# Patient Record
Sex: Female | Born: 1960 | Race: White | Hispanic: No | Marital: Married
Health system: Southern US, Community
[De-identification: ages and names within clinical notes are randomized; demographics above are authoritative.]

---

## 2016-01-18 ENCOUNTER — Inpatient Hospital Stay
Admission: AD | Admit: 2016-01-18 | Discharge: 2016-02-25 | Disposition: A | Discharge status: Skilled Nursing Facility | Payer: Medicare Other | Source: Ambulatory Visit | Attending: Internal Medicine | Admitting: Internal Medicine

## 2016-01-18 ENCOUNTER — Ambulatory Visit (HOSPITAL_COMMUNITY)
Admission: AD | Admit: 2016-01-18 | Discharge: 2016-01-18 | Disposition: A | Discharge status: Home / Self Care | Payer: Medicare Other | Source: Other Acute Inpatient Hospital | Attending: Internal Medicine | Admitting: Internal Medicine

## 2016-01-18 ENCOUNTER — Ambulatory Visit (HOSPITAL_COMMUNITY)
Admission: AD | Admit: 2016-01-18 | Payer: Self-pay | Source: Other Acute Inpatient Hospital | Admitting: Internal Medicine

## 2016-01-18 ENCOUNTER — Other Ambulatory Visit (HOSPITAL_COMMUNITY): Payer: Self-pay

## 2016-01-18 DIAGNOSIS — R1915 Other abnormal bowel sounds: Secondary | ICD-10-CM

## 2016-01-18 DIAGNOSIS — R509 Fever, unspecified: Secondary | ICD-10-CM

## 2016-01-18 DIAGNOSIS — Z430 Encounter for attention to tracheostomy: Secondary | ICD-10-CM

## 2016-01-18 DIAGNOSIS — J9690 Respiratory failure, unspecified, unspecified whether with hypoxia or hypercapnia: Secondary | ICD-10-CM | POA: Diagnosis present

## 2016-01-18 DIAGNOSIS — J9600 Acute respiratory failure, unspecified whether with hypoxia or hypercapnia: Secondary | ICD-10-CM

## 2016-01-18 DIAGNOSIS — K567 Ileus, unspecified: Secondary | ICD-10-CM

## 2016-01-18 DIAGNOSIS — T80219A Unspecified infection due to central venous catheter, initial encounter: Secondary | ICD-10-CM

## 2016-01-18 DIAGNOSIS — K659 Peritonitis, unspecified: Secondary | ICD-10-CM

## 2016-01-18 DIAGNOSIS — Z431 Encounter for attention to gastrostomy: Secondary | ICD-10-CM

## 2016-01-18 DIAGNOSIS — R061 Stridor: Secondary | ICD-10-CM

## 2016-01-18 MED ORDER — DIATRIZOATE MEGLUMINE & SODIUM 66-10 % PO SOLN: 30.0000 mL | Freq: Once | ORAL | Status: DC

## 2016-01-18 MED ORDER — DIATRIZOATE MEGLUMINE & SODIUM 66-10 % PO SOLN
ORAL | Status: AC
  Administered 2016-01-18: 30 mL via GASTROSTOMY
  Filled 2016-01-18: qty 30

## 2016-01-19 ENCOUNTER — Other Ambulatory Visit (HOSPITAL_COMMUNITY): Payer: Self-pay

## 2016-01-19 DIAGNOSIS — Z930 Tracheostomy status: Secondary | ICD-10-CM

## 2016-01-19 DIAGNOSIS — J441 Chronic obstructive pulmonary disease with (acute) exacerbation: Secondary | ICD-10-CM

## 2016-01-19 DIAGNOSIS — J189 Pneumonia, unspecified organism: Secondary | ICD-10-CM

## 2016-01-19 DIAGNOSIS — J9621 Acute and chronic respiratory failure with hypoxia: Secondary | ICD-10-CM | POA: Diagnosis not present

## 2016-01-19 LAB — COMPREHENSIVE METABOLIC PANEL
ALBUMIN: 1.9 g/dL — AB (ref 3.5–5.0)
ALBUMIN: 1.8 g/dL — AB (ref 3.5–5.0)
ALK PHOS: 78 U/L (ref 38–126)
ALT: 28 U/L (ref 14–54)
ALT: 28 U/L (ref 14–54)
ANION GAP: 10 (ref 5–15)
AST: 36 U/L (ref 15–41)
AST: 34 U/L (ref 15–41)
Alkaline Phosphatase: 77 U/L (ref 38–126)
Anion gap: 12 (ref 5–15)
BILIRUBIN TOTAL: 1 mg/dL (ref 0.3–1.2)
BILIRUBIN TOTAL: 1.3 mg/dL — AB (ref 0.3–1.2)
BUN: 31 mg/dL — AB (ref 6–20)
BUN: 31 mg/dL — AB (ref 6–20)
CALCIUM: 8.8 mg/dL — AB (ref 8.9–10.3)
CHLORIDE: 115 mmol/L — AB (ref 101–111)
CO2: 21 mmol/L — AB (ref 22–32)
CO2: 20 mmol/L — ABNORMAL LOW (ref 22–32)
CREATININE: 1.46 mg/dL — AB (ref 0.44–1.00)
Calcium: 8.6 mg/dL — ABNORMAL LOW (ref 8.9–10.3)
Chloride: 114 mmol/L — ABNORMAL HIGH (ref 101–111)
Creatinine, Ser: 1.45 mg/dL — ABNORMAL HIGH (ref 0.44–1.00)
GFR calc Af Amer: 46 mL/min — ABNORMAL LOW (ref >60)
GFR calc Af Amer: 46 mL/min — ABNORMAL LOW (ref >60)
GFR calc non Af Amer: 40 mL/min — ABNORMAL LOW (ref >60)
GFR, EST NON AFRICAN AMERICAN: 39 mL/min — AB (ref >60)
GLUCOSE: 168 mg/dL — AB (ref 65–99)
GLUCOSE: 153 mg/dL — AB (ref 65–99)
Potassium: 3.8 mmol/L (ref 3.5–5.1)
Potassium: 3.7 mmol/L (ref 3.5–5.1)
SODIUM: 145 mmol/L (ref 135–145)
Sodium: 147 mmol/L — ABNORMAL HIGH (ref 135–145)
TOTAL PROTEIN: 7.2 g/dL (ref 6.5–8.1)
Total Protein: 7.1 g/dL (ref 6.5–8.1)

## 2016-01-19 LAB — BLOOD GAS, ARTERIAL
ACID-BASE DEFICIT: 0.1 mmol/L (ref 0.0–2.0)
Bicarbonate: 24.2 mmol/L (ref 20.0–28.0)
FIO2: 40
MECHVT: 500 mL
O2 SAT: 99 %
PEEP/CPAP: 5 cmH2O
PH ART: 7.384 (ref 7.350–7.450)
PO2 ART: 156 mmHg — AB (ref 83.0–108.0)
Patient temperature: 99.6
RATE: 22 resp/min
pCO2 arterial: 41.8 mmHg (ref 32.0–48.0)

## 2016-01-19 LAB — CBC
HCT: 30.3 % — ABNORMAL LOW (ref 36.0–46.0)
HEMOGLOBIN: 9 g/dL — AB (ref 12.0–15.0)
MCH: 27.3 pg (ref 26.0–34.0)
MCHC: 29.7 g/dL — AB (ref 30.0–36.0)
MCV: 91.8 fL (ref 78.0–100.0)
Platelets: 424 10*3/uL — ABNORMAL HIGH (ref 150–400)
RBC: 3.3 MIL/uL — ABNORMAL LOW (ref 3.87–5.11)
RDW: 15.4 % (ref 11.5–15.5)
WBC: 9.3 10*3/uL (ref 4.0–10.5)

## 2016-01-19 LAB — PROTIME-INR
INR: 1.2
PROTHROMBIN TIME: 15.3 s — AB (ref 11.4–15.2)

## 2016-01-19 LAB — VANCOMYCIN, TROUGH: Vancomycin Tr: 17 ug/mL (ref 15–20)

## 2016-01-19 LAB — PHOSPHORUS: PHOSPHORUS: 2.9 mg/dL (ref 2.5–4.6)

## 2016-01-19 LAB — TRIGLYCERIDES: TRIGLYCERIDES: 512 mg/dL — AB (ref <150)

## 2016-01-19 LAB — MAGNESIUM: Magnesium: 2.3 mg/dL (ref 1.7–2.4)

## 2016-01-19 NOTE — Consult Note (Signed)
Name: Brenda Soto MRN: [id number] DOB: May 12, 1960    ADMISSION DATE:  01/18/2016 CONSULTATION DATE:  9/27  REFERRING MD :  Sharyon Medicus ([facility])   CHIEF COMPLAINT:  Vent management   BRIEF PATIENT DESCRIPTION: 55yo female obese female heavy smoker with hx chronic respiratory failure on 2-3L home O2 r/t COPD as well as hx HTN, DM, diverticulitis who was originally admitted to outside hospital 9/14 with abd pain.  She was found to have sigmoid abscess with perforation and was taken urgently to OR for exploratory lap with sigmoidectomy and diverting ileostomy.  She remained vented post op and failed numerous SBT's and ultimately underwent tracheostomy and PEG placement.  Course was c/b severe sepsis r/t peritonitis and acute renal failure.  She was tx to [facility] 9[facility] for ongoing attempts at vent wean.  PCCM consulted to assist.   SIGNIFICANT EVENTS    STUDIES:     HISTORY OF PRESENT ILLNESS:  55yo female obese female smoker with hx chronic respiratory failure r/t COPD and diverticulitis who was originally admitted to outside hospital 9/14 with abd pain.  She was found to have sigmoid abscess with perforation and was taken urgently to OR for exploratory lap with sigmoidectomy and diverting ileostomy.  She remained vented post op and failed numerous SBT's.  Course was c/b severe sepsis r/t peritonitis.  She was tx to [facility] 9/26 for ongoing attempts at vent wean.  PCCM consulted to assist.   Currently resting comfortably.  Denies pain.  Family at bedside.  On full support.    PAST MEDICAL HISTORY :  COPD  Chronic respiratory failure  Sepsis  diverticulitis  Anxiety  HTN  Obesity   Prior to Admission medications   Not on File   Allergies  Allergen Reactions  . Codeine   . Morphine And Related   . Sulfur     FAMILY HISTORY:  family history is not on file. SOCIAL HISTORY: Active smoker   REVIEW OF SYSTEMS:   As per HPI - All other systems reviewed and were neg.     SUBJECTIVE:   VITAL SIGNS: Reviewed at bedside.  Abnormal values discussed in Imp/plan.   PHYSICAL EXAMINATION: General:  Obese female, NAD on vent  Neuro:  Somnolent but arousable (ABG ok), follows commands, smiles at joke from her family, MAE, gen weakness  HEENT:  Mm moist, trach c/d  Cardiovascular:  s1s2 rrr Lungs:  resps even non labored on vent, diminished bases, rare exp wheeze  Abdomen:  Round, soft, mildly tender, PEG, midline incision  Musculoskeletal:  1+ BLE edema   No results for input(s): NA, K, CL, CO2, BUN, CREATININE, GLUCOSE in the last 168 hours. No results for input(s): HGB, HCT, WBC, PLT in the last 168 hours. Dg Chest Port 1 View  Result Date: 01/19/2016 CLINICAL DATA:  Chronic respiratory failure EXAM: PORTABLE CHEST 1 VIEW COMPARISON:  None. FINDINGS: Tracheostomy catheter tip is 5.3 cm above the carina. Central catheter tip is in the superior vena cava. No pneumothorax. There is no appreciable edema or consolidation. Heart is borderline prominent with pulmonary vascularity within normal limits. There is prominence in the inferior right hilar region which may be due to superimposed vessels but raises concern for potential lymph node enlargement in this area. No other areas suggesting potential adenopathy. No bone lesions. IMPRESSION: Question vascular prominence versus adenopathy inferior right hilar region. Upright frontal and lateral views advised if patient is able. No edema or consolidation. Tube and catheter positions as described without  pneumothorax. Electronically Signed   By: Bretta BangWilliam  Woodruff III M.D.   On: 01/19/2016 07:55   Dg Abd Portable 1v  Result Date: 01/18/2016 CLINICAL DATA:  G-tube readjustment.  Initial encounter. EXAM: PORTABLE ABDOMEN - 1 VIEW COMPARISON:  None. FINDINGS: The patient's G-tube is seen overlying the body of the stomach, with injected contrast filling the fundus of the stomach as expected. There is dilatation of small-bowel  loops to 4.9 cm in maximal diameter, which may reflect ileus or small-bowel obstruction. Would correlate with the patient's symptoms. No free intra-abdominal air is seen, though evaluation for free air is limited on a single supine view. No acute osseous abnormalities are identified. IMPRESSION: 1. G-tube noted overlying the body of the stomach, with injected contrast filling the fundus of the stomach as expected. 2. Dilatation of small-bowel loops to 4.9 cm in maximal diameter, which may reflect ileus or small-bowel obstruction. Would correlate with the patient's symptoms. Electronically Signed   By: Roanna RaiderJeffery  Chang M.D.   On: 01/18/2016 22:44    ASSESSMENT / PLAN:  Acute on chronic respiratory failure - post exploratory lap with severe sepsis and underlying severe COPD.  Severe sepsis/peritonitis - resolved.  AKI   REC -  Continue vanc, zosyn per primary -- can likely narrow quickly. Unsure how many days total abx she has had Continue BD's - pulmicort, duoneb  Vent wean per protocol  PT/OT  Check cbc, chem now  Smoking cessation  F/u CXR  Pulmonary hygiene  No need steroids at this time    Dirk DressKaty Whiteheart, NP 01/19/2016  9:38 AM Pager: [phone] or [phone]  Attending Note:  55 year old female with acute on chronic respiratory failure post ex-lap with severe sepsis and COPD.  PCCM consulted for vent management.  On exam, patient is sedate and not following commands with coarse BS diffusely.  I reviewed CXR myself, trach in good position.  Discussed with PCCM-NP.  COPD:  - BD as ordered.  - Pulmicort.  - PRN albuterol.  Respiratory failure:  - Proceed with weaning per protocol.  - Keep dry.  Hypoxemia:  - Titrate O2 for sat of 88-92%  HCAP:  - Vanc and zosyn.  - F/U on cultures.  Trach status:  - Maintain current size and type.  Patient seen and examined, agree with above note.  I dictated the care and orders written for this patient under my  direction.  Alyson ReedyWesam G Dolores Mcgovern, MD [phone]

## 2016-01-20 ENCOUNTER — Other Ambulatory Visit (HOSPITAL_COMMUNITY): Payer: Self-pay

## 2016-01-20 LAB — COMPREHENSIVE METABOLIC PANEL
ALT: 26 U/L (ref 14–54)
AST: 26 U/L (ref 15–41)
Albumin: 1.9 g/dL — ABNORMAL LOW (ref 3.5–5.0)
Alkaline Phosphatase: 74 U/L (ref 38–126)
Anion gap: 8 (ref 5–15)
BILIRUBIN TOTAL: 0.7 mg/dL (ref 0.3–1.2)
BUN: 33 mg/dL — ABNORMAL HIGH (ref 6–20)
CALCIUM: 8.6 mg/dL — AB (ref 8.9–10.3)
CO2: 23 mmol/L (ref 22–32)
CREATININE: 1.5 mg/dL — AB (ref 0.44–1.00)
Chloride: 117 mmol/L — ABNORMAL HIGH (ref 101–111)
GFR calc Af Amer: 44 mL/min — ABNORMAL LOW (ref >60)
GFR, EST NON AFRICAN AMERICAN: 38 mL/min — AB (ref >60)
Glucose, Bld: 217 mg/dL — ABNORMAL HIGH (ref 65–99)
Potassium: 3.3 mmol/L — ABNORMAL LOW (ref 3.5–5.1)
Sodium: 148 mmol/L — ABNORMAL HIGH (ref 135–145)
Total Protein: 7 g/dL (ref 6.5–8.1)

## 2016-01-21 LAB — BASIC METABOLIC PANEL
ANION GAP: 7 (ref 5–15)
BUN: 35 mg/dL — ABNORMAL HIGH (ref 6–20)
CALCIUM: 8.5 mg/dL — AB (ref 8.9–10.3)
CO2: 20 mmol/L — AB (ref 22–32)
CREATININE: 1.58 mg/dL — AB (ref 0.44–1.00)
Chloride: 118 mmol/L — ABNORMAL HIGH (ref 101–111)
GFR calc non Af Amer: 36 mL/min — ABNORMAL LOW (ref >60)
GFR, EST AFRICAN AMERICAN: 41 mL/min — AB (ref >60)
Glucose, Bld: 251 mg/dL — ABNORMAL HIGH (ref 65–99)
Potassium: 3.7 mmol/L (ref 3.5–5.1)
SODIUM: 145 mmol/L (ref 135–145)

## 2016-01-21 LAB — HEPATIC FUNCTION PANEL
ALBUMIN: 1.9 g/dL — AB (ref 3.5–5.0)
ALT: 22 U/L (ref 14–54)
AST: 20 U/L (ref 15–41)
Alkaline Phosphatase: 70 U/L (ref 38–126)
BILIRUBIN TOTAL: 0.9 mg/dL (ref 0.3–1.2)
Bilirubin, Direct: 0.3 mg/dL (ref 0.1–0.5)
Indirect Bilirubin: 0.6 mg/dL (ref 0.3–0.9)
TOTAL PROTEIN: 6.6 g/dL (ref 6.5–8.1)

## 2016-01-22 LAB — BASIC METABOLIC PANEL
ANION GAP: 9 (ref 5–15)
BUN: 33 mg/dL — ABNORMAL HIGH (ref 6–20)
CO2: 19 mmol/L — ABNORMAL LOW (ref 22–32)
Calcium: 8.2 mg/dL — ABNORMAL LOW (ref 8.9–10.3)
Chloride: 114 mmol/L — ABNORMAL HIGH (ref 101–111)
Creatinine, Ser: 1.37 mg/dL — ABNORMAL HIGH (ref 0.44–1.00)
GFR calc Af Amer: 49 mL/min — ABNORMAL LOW (ref >60)
GFR, EST NON AFRICAN AMERICAN: 43 mL/min — AB (ref >60)
GLUCOSE: 282 mg/dL — AB (ref 65–99)
POTASSIUM: 3.8 mmol/L (ref 3.5–5.1)
SODIUM: 142 mmol/L (ref 135–145)

## 2016-01-22 LAB — VANCOMYCIN, TROUGH: VANCOMYCIN TR: 20 ug/mL (ref 15–20)

## 2016-01-23 LAB — CBC WITH DIFFERENTIAL/PLATELET
BASOS ABS: 0 10*3/uL (ref 0.0–0.1)
Basophils Relative: 0 %
Eosinophils Absolute: 0.2 10*3/uL (ref 0.0–0.7)
Eosinophils Relative: 3 %
HEMATOCRIT: 29.7 % — AB (ref 36.0–46.0)
HEMOGLOBIN: 8.6 g/dL — AB (ref 12.0–15.0)
LYMPHS PCT: 18 %
Lymphs Abs: 1.4 10*3/uL (ref 0.7–4.0)
MCH: 27 pg (ref 26.0–34.0)
MCHC: 29 g/dL — ABNORMAL LOW (ref 30.0–36.0)
MCV: 93.1 fL (ref 78.0–100.0)
MONO ABS: 0.4 10*3/uL (ref 0.1–1.0)
MONOS PCT: 5 %
NEUTROS ABS: 5.7 10*3/uL (ref 1.7–7.7)
Neutrophils Relative %: 74 %
Platelets: 329 10*3/uL (ref 150–400)
RBC: 3.19 MIL/uL — ABNORMAL LOW (ref 3.87–5.11)
RDW: 15.2 % (ref 11.5–15.5)
WBC: 7.7 10*3/uL (ref 4.0–10.5)

## 2016-01-23 LAB — COMPREHENSIVE METABOLIC PANEL
ALK PHOS: 64 U/L (ref 38–126)
ALT: 17 U/L (ref 14–54)
ANION GAP: 7 (ref 5–15)
AST: 13 U/L — AB (ref 15–41)
Albumin: 1.8 g/dL — ABNORMAL LOW (ref 3.5–5.0)
BILIRUBIN TOTAL: 0.7 mg/dL (ref 0.3–1.2)
BUN: 34 mg/dL — ABNORMAL HIGH (ref 6–20)
CALCIUM: 8.6 mg/dL — AB (ref 8.9–10.3)
CO2: 21 mmol/L — ABNORMAL LOW (ref 22–32)
Chloride: 119 mmol/L — ABNORMAL HIGH (ref 101–111)
Creatinine, Ser: 1.43 mg/dL — ABNORMAL HIGH (ref 0.44–1.00)
GFR calc Af Amer: 47 mL/min — ABNORMAL LOW (ref >60)
GFR, EST NON AFRICAN AMERICAN: 40 mL/min — AB (ref >60)
GLUCOSE: 258 mg/dL — AB (ref 65–99)
Potassium: 3.8 mmol/L (ref 3.5–5.1)
Sodium: 147 mmol/L — ABNORMAL HIGH (ref 135–145)
TOTAL PROTEIN: 6.4 g/dL — AB (ref 6.5–8.1)

## 2016-01-24 DIAGNOSIS — J9601 Acute respiratory failure with hypoxia: Secondary | ICD-10-CM | POA: Diagnosis not present

## 2016-01-24 DIAGNOSIS — Z430 Encounter for attention to tracheostomy: Secondary | ICD-10-CM | POA: Diagnosis not present

## 2016-01-24 DIAGNOSIS — J9600 Acute respiratory failure, unspecified whether with hypoxia or hypercapnia: Secondary | ICD-10-CM

## 2016-01-24 LAB — COMPREHENSIVE METABOLIC PANEL
ALK PHOS: 76 U/L (ref 38–126)
ALT: 19 U/L (ref 14–54)
AST: 22 U/L (ref 15–41)
Albumin: 1.9 g/dL — ABNORMAL LOW (ref 3.5–5.0)
Anion gap: 7 (ref 5–15)
BUN: 31 mg/dL — AB (ref 6–20)
CALCIUM: 8.6 mg/dL — AB (ref 8.9–10.3)
CO2: 22 mmol/L (ref 22–32)
CREATININE: 1.31 mg/dL — AB (ref 0.44–1.00)
Chloride: 117 mmol/L — ABNORMAL HIGH (ref 101–111)
GFR calc non Af Amer: 45 mL/min — ABNORMAL LOW (ref >60)
GFR, EST AFRICAN AMERICAN: 52 mL/min — AB (ref >60)
Glucose, Bld: 253 mg/dL — ABNORMAL HIGH (ref 65–99)
Potassium: 4.1 mmol/L (ref 3.5–5.1)
SODIUM: 146 mmol/L — AB (ref 135–145)
Total Bilirubin: 0.8 mg/dL (ref 0.3–1.2)
Total Protein: 6.4 g/dL — ABNORMAL LOW (ref 6.5–8.1)

## 2016-01-24 LAB — TRIGLYCERIDES: Triglycerides: 479 mg/dL — ABNORMAL HIGH (ref <150)

## 2016-01-24 LAB — PHOSPHORUS: PHOSPHORUS: 3.7 mg/dL (ref 2.5–4.6)

## 2016-01-24 LAB — MAGNESIUM: Magnesium: 2.2 mg/dL (ref 1.7–2.4)

## 2016-01-24 NOTE — Progress Notes (Signed)
   Name: Brenda Soto MRN: [id number] DOB: 1960-10-03    ADMISSION DATE:  01/18/2016 CONSULTATION DATE:  9/27  REFERRING MD :  Sharyon MedicusHijazi ([facility])   CHIEF COMPLAINT:  Vent management   BRIEF PATIENT DESCRIPTION: 55yo female obese female heavy smoker with hx chronic respiratory failure on 2-3L home O2 r/t COPD as well as hx HTN, DM, diverticulitis who was originally admitted to outside hospital 9/14 with abd pain.  She was found to have sigmoid abscess with perforation and was taken urgently to OR for exploratory lap with sigmoidectomy and diverting ileostomy.  She remained vented post op and failed numerous SBT's and ultimately underwent tracheostomy and PEG placement.  Course was c/b severe sepsis r/t peritonitis and acute renal failure.  She was tx to [facility] for ongoing attempts at vent wean.  PCCM consulted to assist.   SIGNIFICANT EVENTS    STUDIES:     HISTORY OF PRESENT ILLNESS:  55yo female obese female smoker with hx chronic respiratory failure r/t COPD and diverticulitis who was originally admitted to outside hospital 9/14 with abd pain.  She was found to have sigmoid abscess with perforation and was taken urgently to OR for exploratory lap with sigmoidectomy and diverting ileostomy.  She remained vented post op and failed numerous SBT's.  Course was c/b severe sepsis r/t peritonitis.  She was tx to [facility] for ongoing attempts at vent wean.  PCCM consulted to assist.    SUBJECTIVE:  Comfortable but with anxiety issues. Doing PST on vent x 12 hrs alternating with ATC.  With anxiety issues.    VITAL SIGNS: Reviewed at bedside. HR 80s. O2 sats 98% on 30% Fio2.  PHYSICAL EXAMINATION: General:  Obese female, NAD on vent  Neuro:  Somnolent but arousable (ABG ok), follows commands, smiles at joke from her family, MAE, gen weakness  HEENT:  Mm moist, trach c/d  Cardiovascular:  s1s2 rrr Lungs:  resps even non labored on vent, diminished bases, rare exp wheeze    Abdomen:  Round, soft, mildly tender, PEG, midline incision  Musculoskeletal:  1+ BLE edema    Recent Labs Lab 01/22/16 0752 01/23/16 0624 01/24/16 1042  NA 142 147* 146*  K 3.8 3.8 4.1  CL 114* 119* 117*  CO2 19* 21* 22  BUN 33* 34* 31*  CREATININE 1.37* 1.43* 1.31*  GLUCOSE 282* 258* 253*    Recent Labs Lab 01/19/16 1048 01/23/16 0624  HGB 9.0* 8.6*  HCT 30.3* 29.7*  WBC 9.3 7.7  PLT 424* 329   No results found.  ASSESSMENT / PLAN:  Acute on chronic respiratory failure - post exploratory lap with severe sepsis and underlying severe COPD.  S/P tracheostomy Severe sepsis/peritonitis - resolved.  AKI  Anxiety  REC -  PST and ATC as tolerated. Wean off o2 to keep o2 sats > 88%. Need to address anxiety issues which will affect weaning.  Deescalate abx once able.  Continue BD's - pulmicort, duoneb  Vent wean per protocol  PT/OT  Pulmonary hygiene   J. Alexis FrockAngelo A de Dios, MD 01/24/2016, 2:58 PM [facility] Pager [phone] After 3 pm or if no answer, call [phone]

## 2016-01-25 ENCOUNTER — Other Ambulatory Visit (HOSPITAL_COMMUNITY): Payer: Self-pay

## 2016-01-25 LAB — COMPREHENSIVE METABOLIC PANEL
ALT: 19 U/L (ref 14–54)
AST: 18 U/L (ref 15–41)
Albumin: 2 g/dL — ABNORMAL LOW (ref 3.5–5.0)
Alkaline Phosphatase: 80 U/L (ref 38–126)
Anion gap: 7 (ref 5–15)
BILIRUBIN TOTAL: 0.4 mg/dL (ref 0.3–1.2)
BUN: 30 mg/dL — ABNORMAL HIGH (ref 6–20)
CHLORIDE: 115 mmol/L — AB (ref 101–111)
CO2: 20 mmol/L — ABNORMAL LOW (ref 22–32)
CREATININE: 1.17 mg/dL — AB (ref 0.44–1.00)
Calcium: 8.6 mg/dL — ABNORMAL LOW (ref 8.9–10.3)
GFR, EST AFRICAN AMERICAN: 60 mL/min — AB (ref >60)
GFR, EST NON AFRICAN AMERICAN: 51 mL/min — AB (ref >60)
Glucose, Bld: 188 mg/dL — ABNORMAL HIGH (ref 65–99)
POTASSIUM: 3.8 mmol/L (ref 3.5–5.1)
Sodium: 142 mmol/L (ref 135–145)
TOTAL PROTEIN: 6.5 g/dL (ref 6.5–8.1)

## 2016-01-25 LAB — HEMOGLOBIN AND HEMATOCRIT, BLOOD
HEMATOCRIT: 27.1 % — AB (ref 36.0–46.0)
HEMOGLOBIN: 8.1 g/dL — AB (ref 12.0–15.0)

## 2016-01-26 LAB — MAGNESIUM: MAGNESIUM: 2.2 mg/dL (ref 1.7–2.4)

## 2016-01-26 LAB — BASIC METABOLIC PANEL
ANION GAP: 7 (ref 5–15)
BUN: 35 mg/dL — AB (ref 6–20)
CO2: 21 mmol/L — AB (ref 22–32)
Calcium: 8.6 mg/dL — ABNORMAL LOW (ref 8.9–10.3)
Chloride: 112 mmol/L — ABNORMAL HIGH (ref 101–111)
Creatinine, Ser: 1.36 mg/dL — ABNORMAL HIGH (ref 0.44–1.00)
GFR calc Af Amer: 50 mL/min — ABNORMAL LOW (ref >60)
GFR, EST NON AFRICAN AMERICAN: 43 mL/min — AB (ref >60)
GLUCOSE: 215 mg/dL — AB (ref 65–99)
POTASSIUM: 4.7 mmol/L (ref 3.5–5.1)
Sodium: 140 mmol/L (ref 135–145)

## 2016-01-27 ENCOUNTER — Other Ambulatory Visit (HOSPITAL_COMMUNITY): Payer: Self-pay

## 2016-01-27 DIAGNOSIS — R0902 Hypoxemia: Secondary | ICD-10-CM | POA: Diagnosis not present

## 2016-01-27 DIAGNOSIS — F411 Generalized anxiety disorder: Secondary | ICD-10-CM

## 2016-01-27 DIAGNOSIS — J9601 Acute respiratory failure with hypoxia: Secondary | ICD-10-CM | POA: Diagnosis not present

## 2016-01-27 DIAGNOSIS — Z430 Encounter for attention to tracheostomy: Secondary | ICD-10-CM | POA: Diagnosis not present

## 2016-01-27 LAB — URINALYSIS, ROUTINE W REFLEX MICROSCOPIC
Bilirubin Urine: NEGATIVE
Glucose, UA: NEGATIVE mg/dL
Ketones, ur: NEGATIVE mg/dL
Nitrite: NEGATIVE
Protein, ur: 100 mg/dL — AB
Specific Gravity, Urine: 1.011 (ref 1.005–1.030)
pH: 5.5 (ref 5.0–8.0)

## 2016-01-27 LAB — HEPATIC FUNCTION PANEL
ALBUMIN: 1.9 g/dL — AB (ref 3.5–5.0)
ALK PHOS: 121 U/L (ref 38–126)
ALT: 22 U/L (ref 14–54)
AST: 27 U/L (ref 15–41)
BILIRUBIN TOTAL: 0.5 mg/dL (ref 0.3–1.2)
Bilirubin, Direct: 0.2 mg/dL (ref 0.1–0.5)
Indirect Bilirubin: 0.3 mg/dL (ref 0.3–0.9)
TOTAL PROTEIN: 6.5 g/dL (ref 6.5–8.1)

## 2016-01-27 LAB — URINE MICROSCOPIC-ADD ON

## 2016-01-27 LAB — CBC
HEMATOCRIT: 26.3 % — AB (ref 36.0–46.0)
HEMOGLOBIN: 8 g/dL — AB (ref 12.0–15.0)
MCH: 27.2 pg (ref 26.0–34.0)
MCHC: 30.4 g/dL (ref 30.0–36.0)
MCV: 89.5 fL (ref 78.0–100.0)
Platelets: 281 10*3/uL (ref 150–400)
RBC: 2.94 MIL/uL — AB (ref 3.87–5.11)
RDW: 15.7 % — ABNORMAL HIGH (ref 11.5–15.5)
WBC: 7.1 10*3/uL (ref 4.0–10.5)

## 2016-01-27 NOTE — Progress Notes (Signed)
   Name: Brenda Soto MRN: [id number] DOB: 1961/01/29    ADMISSION DATE:  01/18/2016 CONSULTATION DATE:  9/27  REFERRING MD :  Sharyon MedicusHijazi ([facility]   CHIEF COMPLAINT:  Vent management   BRIEF PATIENT DESCRIPTION: MO female on vent  SIGNIFICANT EVENTS    STUDIES:     HISTORY OF PRESENT ILLNESS:  55yo female obese female smoker with hx chronic respiratory failure r/t COPD and diverticulitis who was originally admitted to outside hospital 9/14 with abd pain.  She was found to have sigmoid abscess with perforation and was taken urgently to OR for exploratory lap with sigmoidectomy and diverting ileostomy.  She remained vented post op and failed numerous SBT's.  Course was c/b severe sepsis r/t peritonitis.  She was tx to [facility] for ongoing attempts at vent wean.  PCCM consulted to assist.    SUBJECTIVE:  Comfortable but with anxiety issues. Doing PST on vent x 12 hrs alternating with ATC.  Currently on full vent support    VITAL SIGNS: Reviewed at bedside. 121/74 78 98.7 99% 28  PHYSICAL EXAMINATION: General:  Obese female, NAD on vent  Neuro:  Somnolent but arousable (ABG ok), follows commands.  MAE, gen weakness  HEENT:  Mm moist, trach c/d  Cardiovascular:  s1s2 rrr Lungs:  resps even non labored on vent, diminished bases, rare exp wheeze  Abdomen:  Round, soft, mildly tender, PEG, midline incision  Musculoskeletal:  1+ BLE edema    Recent Labs Lab 01/24/16 1042 01/25/16 1254 01/26/16 0744  NA 146* 142 140  K 4.1 3.8 4.7  CL 117* 115* 112*  CO2 22 20* 21*  BUN 31* 30* 35*  CREATININE 1.31* 1.17* 1.36*  GLUCOSE 253* 188* 215*    Recent Labs Lab 01/23/16 0624 01/25/16 1254  HGB 8.6* 8.1*  HCT 29.7* 27.1*  WBC 7.7  --   PLT 329  --    No results found.  ASSESSMENT / PLAN:  Acute on chronic respiratory failure - post exploratory lap with severe sepsis and underlying severe COPD.  S/P tracheostomy Severe sepsis/peritonitis - resolved.  AKI    Anxiety  REC -  PST and ATC as tolerated. Wean off o2 to keep o2 sats > 88%. Need to address anxiety issues which will affect weaning.  Deescalate abx once able.  Continue BD's - pulmicort, duoneb  Vent wean per protocol  PT/OT  Pulmonary hygiene   PCCM will see weekly on Mondays  [facility] Minor ACNP Ad[facility] PollackLe [facility]auer PCCM Pager [phone] till 3 pm If no answer page [phone] 01/27/2016, 10:48 AM  Attending Note:  55 year old female with morbid obesity had a perforation and was difficult to wean.  Was trached. On exam, patient appears very anxious.  Suspect anxiety is a major component of her failure.  I reviewed CXR myself, trach is in good position.  Discussed with PCCM-NP and [facility]-RT.    Respiratory failure:  - Goal PS for 12 hours.  - Maintain 6XLT trach.  Anxiety:  - Klonipin  - Monitor.  Hypoxemia:  - Titrate O2 for sat of 88-92%.  Trach status:  - Maintain current trach size and type.  Patient seen and examined, agree with above note.  I dictated the care and orders written for this patient under my direction.  Alyson ReedyWesam G Yacoub, MD [phone]

## 2016-01-28 LAB — BLOOD CULTURE ID PANEL (REFLEXED)

## 2016-01-28 LAB — HEPATIC FUNCTION PANEL
ALT: 20 U/L (ref 14–54)
AST: 20 U/L (ref 15–41)
Albumin: 1.9 g/dL — ABNORMAL LOW (ref 3.5–5.0)
Alkaline Phosphatase: 105 U/L (ref 38–126)
BILIRUBIN DIRECT: 0.2 mg/dL (ref 0.1–0.5)
Indirect Bilirubin: 0.3 mg/dL (ref 0.3–0.9)
Total Bilirubin: 0.5 mg/dL (ref 0.3–1.2)
Total Protein: 6.3 g/dL — ABNORMAL LOW (ref 6.5–8.1)

## 2016-01-28 LAB — BASIC METABOLIC PANEL
Anion gap: 8 (ref 5–15)
BUN: 34 mg/dL — AB (ref 6–20)
CO2: 23 mmol/L (ref 22–32)
CREATININE: 1.45 mg/dL — AB (ref 0.44–1.00)
Calcium: 8.6 mg/dL — ABNORMAL LOW (ref 8.9–10.3)
Chloride: 107 mmol/L (ref 101–111)
GFR, EST AFRICAN AMERICAN: 46 mL/min — AB (ref >60)
GFR, EST NON AFRICAN AMERICAN: 40 mL/min — AB (ref >60)
GLUCOSE: 255 mg/dL — AB (ref 65–99)
Potassium: 4.6 mmol/L (ref 3.5–5.1)
SODIUM: 138 mmol/L (ref 135–145)

## 2016-01-29 LAB — URINE CULTURE

## 2016-01-29 LAB — BASIC METABOLIC PANEL
ANION GAP: 6 (ref 5–15)
BUN: 39 mg/dL — AB (ref 6–20)
CALCIUM: 8.4 mg/dL — AB (ref 8.9–10.3)
CO2: 22 mmol/L (ref 22–32)
Chloride: 112 mmol/L — ABNORMAL HIGH (ref 101–111)
Creatinine, Ser: 1.59 mg/dL — ABNORMAL HIGH (ref 0.44–1.00)
GFR calc Af Amer: 41 mL/min — ABNORMAL LOW (ref >60)
GFR, EST NON AFRICAN AMERICAN: 36 mL/min — AB (ref >60)
GLUCOSE: 194 mg/dL — AB (ref 65–99)
POTASSIUM: 5.1 mmol/L (ref 3.5–5.1)
SODIUM: 140 mmol/L (ref 135–145)

## 2016-01-29 LAB — BLOOD CULTURE ID PANEL (REFLEXED)
Acinetobacter baumannii: NOT DETECTED
Candida albicans: NOT DETECTED
Candida glabrata: DETECTED — AB
Candida krusei: NOT DETECTED
Candida parapsilosis: NOT DETECTED
Candida tropicalis: NOT DETECTED
ENTEROBACTER CLOACAE COMPLEX: NOT DETECTED
ENTEROCOCCUS SPECIES: NOT DETECTED
ESCHERICHIA COLI: NOT DETECTED
Enterobacteriaceae species: NOT DETECTED
Haemophilus influenzae: NOT DETECTED
Klebsiella oxytoca: NOT DETECTED
Klebsiella pneumoniae: NOT DETECTED
LISTERIA MONOCYTOGENES: NOT DETECTED
NEISSERIA MENINGITIDIS: NOT DETECTED
PROTEUS SPECIES: NOT DETECTED
PSEUDOMONAS AERUGINOSA: NOT DETECTED
SERRATIA MARCESCENS: NOT DETECTED
STAPHYLOCOCCUS AUREUS BCID: NOT DETECTED
STAPHYLOCOCCUS SPECIES: NOT DETECTED
STREPTOCOCCUS AGALACTIAE: NOT DETECTED
Streptococcus pneumoniae: NOT DETECTED
Streptococcus pyogenes: NOT DETECTED
Streptococcus species: NOT DETECTED

## 2016-01-29 LAB — CULTURE, BLOOD (ROUTINE X 2)

## 2016-01-30 LAB — CULTURE, RESPIRATORY W GRAM STAIN

## 2016-01-30 LAB — CULTURE, RESPIRATORY

## 2016-01-31 ENCOUNTER — Other Ambulatory Visit (HOSPITAL_COMMUNITY): Payer: Self-pay

## 2016-01-31 DIAGNOSIS — J9600 Acute respiratory failure, unspecified whether with hypoxia or hypercapnia: Secondary | ICD-10-CM

## 2016-01-31 LAB — TRIGLYCERIDES: Triglycerides: 495 mg/dL — ABNORMAL HIGH (ref <150)

## 2016-01-31 LAB — CBC
HEMATOCRIT: 26 % — AB (ref 36.0–46.0)
Hemoglobin: 7.6 g/dL — ABNORMAL LOW (ref 12.0–15.0)
MCH: 26.6 pg (ref 26.0–34.0)
MCHC: 29.2 g/dL — ABNORMAL LOW (ref 30.0–36.0)
MCV: 90.9 fL (ref 78.0–100.0)
Platelets: 295 10*3/uL (ref 150–400)
RBC: 2.86 MIL/uL — AB (ref 3.87–5.11)
RDW: 16.2 % — ABNORMAL HIGH (ref 11.5–15.5)
WBC: 7.6 10*3/uL (ref 4.0–10.5)

## 2016-01-31 LAB — COMPREHENSIVE METABOLIC PANEL
ALBUMIN: 1.9 g/dL — AB (ref 3.5–5.0)
ALK PHOS: 219 U/L — AB (ref 38–126)
ALT: 20 U/L (ref 14–54)
ANION GAP: 9 (ref 5–15)
AST: 19 U/L (ref 15–41)
BUN: 45 mg/dL — ABNORMAL HIGH (ref 6–20)
CO2: 23 mmol/L (ref 22–32)
Calcium: 8.5 mg/dL — ABNORMAL LOW (ref 8.9–10.3)
Chloride: 109 mmol/L (ref 101–111)
Creatinine, Ser: 1.42 mg/dL — ABNORMAL HIGH (ref 0.44–1.00)
GFR calc Af Amer: 47 mL/min — ABNORMAL LOW (ref >60)
GFR calc non Af Amer: 41 mL/min — ABNORMAL LOW (ref >60)
GLUCOSE: 180 mg/dL — AB (ref 65–99)
POTASSIUM: 4.6 mmol/L (ref 3.5–5.1)
SODIUM: 141 mmol/L (ref 135–145)
Total Bilirubin: 0.2 mg/dL — ABNORMAL LOW (ref 0.3–1.2)
Total Protein: 6.8 g/dL (ref 6.5–8.1)

## 2016-01-31 LAB — PHOSPHORUS: Phosphorus: 4.7 mg/dL — ABNORMAL HIGH (ref 2.5–4.6)

## 2016-01-31 LAB — CULTURE, BLOOD (ROUTINE X 2)

## 2016-01-31 LAB — MAGNESIUM: Magnesium: 2.1 mg/dL (ref 1.7–2.4)

## 2016-01-31 NOTE — Progress Notes (Signed)
   Name: Brenda Soto MRN: [id number] DOB: June 15, 1960    ADMISSION DATE:  01/18/2016 CONSULTATION DATE:  9/27  REFERRING MD :  Sharyon MedicusHijazi ([facility]   BRIEF PATIENT DESCRIPTION: MO female on vent  SIGNIFICANT EVENTS   STUDIES:   HISTORY OF PRESENT ILLNESS:  55yo female obese female smoker with hx chronic respiratory failure r/t COPD and diverticulitis who was originally admitted to outside hospital 9/14 with abd pain.  She was found to have sigmoid abscess with perforation and was taken urgently to OR for exploratory lap with sigmoidectomy and diverting ileostomy.  She remained vented post op and failed numerous SBT's.  Course was c/b severe sepsis r/t peritonitis.  She was tx to [facility] for ongoing attempts at vent wean.  PCCM consulted to assist.    SUBJECTIVE:  Currently failing PSV wean, which she has only been on for about 2 hours at this point. Appear anxious.   VITAL SIGNS: Reviewed at bedside. 98.28F, 70, 27, 144/78, 88% on 28% Fio2 PSV 12/5  PHYSICAL EXAMINATION: General:  Obese female, NAD on vent . Appears mildly anxious Neuro:  Awake on vent MAE, gen weakness  HEENT:  Mm moist, trach c/d  Cardiovascular:  s1s2 rrr Lungs:  Distant breath sounds but sound clear. Abdomen:  Round, soft, mildly tender, PEG, midline incision  Musculoskeletal:  1+ BLE edema    Recent Labs Lab 01/28/16 0534 01/29/16 0500 01/31/16 0545  NA 138 140 141  K 4.6 5.1 4.6  CL 107 112* 109  CO2 23 22 23   BUN 34* 39* 45*  CREATININE 1.45* 1.59* 1.42*  GLUCOSE 255* 194* 180*    Recent Labs Lab 01/25/16 1254 01/27/16 1500 01/31/16 0545  HGB 8.1* 8.0* 7.6*  HCT 27.1* 26.3* 26.0*  WBC  --  7.1 7.6  PLT  --  281 295   No results found.  ASSESSMENT / PLAN:  Acute on chronic respiratory failure - post exploratory lap with severe sepsis and underlying severe COPD.  S/P tracheostomy Severe sepsis/peritonitis - resolved.  Fungemia (was on TPN) AKI  Anxiety  REC -  PSV as  tolerated. Wean off O2 to keep o2 sats > 88%. Need to address anxiety issues which will affect weaning. New dx fungemia likely contributing as well. Consider adding antipsychotic Repeat CXR, may need additional diuresis Continue BD's - pulmicort, duoneb [facility]iotics[facility]ifungals [facility]  Joneen RoachPaul Hoffman, AGACNP-BC [place] Pager [phone] or [phone]  01/31/2016 12:42 PM  Attending note: I have seen and examined the patient with nurse practitioner/resident and agree with the note. History, labs and imaging reviewed.  54 Y/O with COPD, diverticulitis, sigmoid abscess s/p e lap with sigmoidectomy and diverting ileostomy Noted to have candidemia, pseudomonas in sputum  Not doing well on weaning trials today. Recheck CXR. Diurese as tolerated. On antibiotics and antifungals as per primary team  Chilton GreathousePraveen Nickola Lenig MD [facility] Pager [phone] If no answer or after 3pm call: [phone] 01/31/2016, 2:30 PM

## 2016-02-02 LAB — BASIC METABOLIC PANEL
ANION GAP: 10 (ref 5–15)
BUN: 38 mg/dL — ABNORMAL HIGH (ref 6–20)
CALCIUM: 9 mg/dL (ref 8.9–10.3)
CO2: 25 mmol/L (ref 22–32)
Chloride: 108 mmol/L (ref 101–111)
Creatinine, Ser: 1.2 mg/dL — ABNORMAL HIGH (ref 0.44–1.00)
GFR calc Af Amer: 58 mL/min — ABNORMAL LOW (ref >60)
GFR calc non Af Amer: 50 mL/min — ABNORMAL LOW (ref >60)
GLUCOSE: 141 mg/dL — AB (ref 65–99)
Potassium: 5.1 mmol/L (ref 3.5–5.1)
Sodium: 143 mmol/L (ref 135–145)

## 2016-02-02 LAB — CBC
HEMATOCRIT: 30.4 % — AB (ref 36.0–46.0)
HEMOGLOBIN: 9.2 g/dL — AB (ref 12.0–15.0)
MCH: 27.1 pg (ref 26.0–34.0)
MCHC: 30.3 g/dL (ref 30.0–36.0)
MCV: 89.4 fL (ref 78.0–100.0)
Platelets: 268 10*3/uL (ref 150–400)
RBC: 3.4 MIL/uL — ABNORMAL LOW (ref 3.87–5.11)
RDW: 16.3 % — ABNORMAL HIGH (ref 11.5–15.5)
WBC: 8.8 10*3/uL (ref 4.0–10.5)

## 2016-02-03 DIAGNOSIS — J9601 Acute respiratory failure with hypoxia: Secondary | ICD-10-CM | POA: Diagnosis not present

## 2016-02-03 NOTE — Progress Notes (Signed)
   Name: Brenda Soto MRN: [id number] DOB: 08/14/1960    ADMISSION DATE:  01/18/2016 CONSULTATION DATE:  9/27  REFERRING MD :  Sharyon MedicusHijazi ([facility]   BRIEF PATIENT DESCRIPTION: MO female on vent  SIGNIFICANT EVENTS   STUDIES:   HISTORY OF PRESENT ILLNESS:  55yo female obese female smoker with hx chronic respiratory failure r/t COPD and diverticulitis who was originally admitted to outside hospital 9/14 with abd pain.  She was found to have sigmoid abscess with perforation and was taken urgently to OR for exploratory lap with sigmoidectomy and diverting ileostomy.  She remained vented post op and failed numerous SBT's.  Course was c/b severe sepsis r/t peritonitis.  She was tx [facility] for ongoing attempts at vent wean.  PCCM consulted to assist.    SUBJECTIVE:  Weaning well on PSV today, achieved 12 hours yesterday.   VITAL SIGNS: Reviewed at bedside. 27F, P75, R19, 98% on 28% Fio2 PSV 12/5  PHYSICAL EXAMINATION: General:  Obese female, NAD on vent . Appears mildly anxious Neuro:  Awake on vent MAE, gen weakness  HEENT:  Mm moist, trach c/d  Cardiovascular:  s1s2 rrr Lungs:  Distant breath sounds but sound clear.. Tolerating SBT well. Abdomen:  Round, soft, mildly tender, PEG, midline incision  Musculoskeletal: trace BLE edema    Recent Labs Lab 01/29/16 0500 01/31/16 0545 02/02/16 0454  NA 140 141 143  K 5.1 4.6 5.1  CL 112* 109 108  CO2 22 23 25   BUN 39* 45* 38*  CREATININE 1.59* 1.42* 1.20*  GLUCOSE 194* 180* 141*    Recent Labs Lab 01/27/16 1500 01/31/16 0545 02/02/16 0454  HGB 8.0* 7.6* 9.2*  HCT 26.3* 26.0* 30.4*  WBC 7.1 7.6 8.8  PLT 281 295 268   No results found.  ASSESSMENT / PLAN:  Acute on chronic respiratory failure - post exploratory lap with severe sepsis and underlying severe COPD.  S/P tracheostomy Severe sepsis/peritonitis - resolved.  Fungemia (was on TPN) AKI  Anxiety  REC -  PSV as tolerated. Wean off O2 to keep o2 sats  > 88%.  Anxiety better controlled, but still needs some work. Consider increase clonazepam or add risperidone New dx fungemia likely contributing as well. Diurese as tolerated.  Continue BD's - pulmicort, duoneb Antibiotics[facility]als [facility]  Joneen RoachPaul Hoffman, AGACNP-BC [place] Pager [phone] or [phone]  Attending note: I have seen and examined the patient with nurse practitioner/resident and agree with the note. History, labs and imaging reviewed.  54 Y/O with COPD, diverticulitis, sigmoid abscess s/p e lap with sigmoidectomy and diverting ileostomy Noted to have candidemia, pseudomonas in sputum  Weaning in PSV. Looks more comfortable than earlier this week. On antibiotics and antifungals as per primary team. Diurese as tolerated.  Brenda GreathousePraveen Aliviah Spain MD [facility] Pager [phone] If no answer or after 3pm call: [phone] 02/03/2016, 1:00 PM

## 2016-02-04 ENCOUNTER — Other Ambulatory Visit (HOSPITAL_COMMUNITY): Payer: Self-pay

## 2016-02-04 LAB — BASIC METABOLIC PANEL
ANION GAP: 10 (ref 5–15)
BUN: 33 mg/dL — ABNORMAL HIGH (ref 6–20)
CALCIUM: 9 mg/dL (ref 8.9–10.3)
CHLORIDE: 104 mmol/L (ref 101–111)
CO2: 25 mmol/L (ref 22–32)
Creatinine, Ser: 1.26 mg/dL — ABNORMAL HIGH (ref 0.44–1.00)
GFR calc Af Amer: 55 mL/min — ABNORMAL LOW (ref >60)
GFR calc non Af Amer: 47 mL/min — ABNORMAL LOW (ref >60)
GLUCOSE: 278 mg/dL — AB (ref 65–99)
Potassium: 5.2 mmol/L — ABNORMAL HIGH (ref 3.5–5.1)
Sodium: 139 mmol/L (ref 135–145)

## 2016-02-06 LAB — CULTURE, BLOOD (ROUTINE X 2)
CULTURE: NO GROWTH
Culture: NO GROWTH

## 2016-02-07 LAB — COMPREHENSIVE METABOLIC PANEL
ALBUMIN: 2.6 g/dL — AB (ref 3.5–5.0)
ALK PHOS: 152 U/L — AB (ref 38–126)
ALT: 31 U/L (ref 14–54)
AST: 41 U/L (ref 15–41)
Anion gap: 10 (ref 5–15)
BUN: 42 mg/dL — AB (ref 6–20)
CALCIUM: 9.1 mg/dL (ref 8.9–10.3)
CHLORIDE: 104 mmol/L (ref 101–111)
CO2: 28 mmol/L (ref 22–32)
CREATININE: 1.13 mg/dL — AB (ref 0.44–1.00)
GFR calc non Af Amer: 54 mL/min — ABNORMAL LOW (ref >60)
GLUCOSE: 108 mg/dL — AB (ref 65–99)
Potassium: 4.3 mmol/L (ref 3.5–5.1)
SODIUM: 142 mmol/L (ref 135–145)
Total Bilirubin: 0.7 mg/dL (ref 0.3–1.2)
Total Protein: 6.8 g/dL (ref 6.5–8.1)

## 2016-02-07 LAB — TRIGLYCERIDES: Triglycerides: 605 mg/dL — ABNORMAL HIGH (ref <150)

## 2016-02-07 LAB — PHOSPHORUS: Phosphorus: 5.5 mg/dL — ABNORMAL HIGH (ref 2.5–4.6)

## 2016-02-07 LAB — MAGNESIUM: Magnesium: 1.9 mg/dL (ref 1.7–2.4)

## 2016-02-09 LAB — BASIC METABOLIC PANEL
ANION GAP: 11 (ref 5–15)
BUN: 28 mg/dL — ABNORMAL HIGH (ref 6–20)
CALCIUM: 9.5 mg/dL (ref 8.9–10.3)
CO2: 26 mmol/L (ref 22–32)
Chloride: 105 mmol/L (ref 101–111)
Creatinine, Ser: 1.08 mg/dL — ABNORMAL HIGH (ref 0.44–1.00)
GFR, EST NON AFRICAN AMERICAN: 57 mL/min — AB (ref >60)
GLUCOSE: 131 mg/dL — AB (ref 65–99)
Potassium: 4.4 mmol/L (ref 3.5–5.1)
Sodium: 142 mmol/L (ref 135–145)

## 2016-02-10 ENCOUNTER — Other Ambulatory Visit (HOSPITAL_COMMUNITY): Payer: Self-pay

## 2016-02-11 ENCOUNTER — Other Ambulatory Visit (HOSPITAL_COMMUNITY): Payer: Self-pay

## 2016-02-11 LAB — BASIC METABOLIC PANEL
ANION GAP: 9 (ref 5–15)
BUN: 33 mg/dL — ABNORMAL HIGH (ref 6–20)
CO2: 27 mmol/L (ref 22–32)
Calcium: 9.5 mg/dL (ref 8.9–10.3)
Chloride: 103 mmol/L (ref 101–111)
Creatinine, Ser: 1.06 mg/dL — ABNORMAL HIGH (ref 0.44–1.00)
GFR calc Af Amer: >60 mL/min (ref >60)
GFR calc non Af Amer: 58 mL/min — ABNORMAL LOW (ref >60)
GLUCOSE: 207 mg/dL — AB (ref 65–99)
POTASSIUM: 4.5 mmol/L (ref 3.5–5.1)
Sodium: 139 mmol/L (ref 135–145)

## 2016-02-11 MED ORDER — IOPAMIDOL (ISOVUE-300) INJECTION 61%
75.0000 mL | Freq: Once | INTRAVENOUS | Status: AC | PRN
  Administered 2016-02-11: 75 mL via INTRAVENOUS

## 2016-02-13 LAB — BASIC METABOLIC PANEL
ANION GAP: 10 (ref 5–15)
BUN: 33 mg/dL — AB (ref 6–20)
CALCIUM: 9.8 mg/dL (ref 8.9–10.3)
CO2: 28 mmol/L (ref 22–32)
Chloride: 104 mmol/L (ref 101–111)
Creatinine, Ser: 1.01 mg/dL — ABNORMAL HIGH (ref 0.44–1.00)
GFR calc Af Amer: >60 mL/min (ref >60)
Glucose, Bld: 153 mg/dL — ABNORMAL HIGH (ref 65–99)
POTASSIUM: 3.8 mmol/L (ref 3.5–5.1)
SODIUM: 142 mmol/L (ref 135–145)

## 2016-02-14 LAB — COMPREHENSIVE METABOLIC PANEL
ALT: 39 U/L (ref 14–54)
AST: 42 U/L — AB (ref 15–41)
Albumin: 3 g/dL — ABNORMAL LOW (ref 3.5–5.0)
Alkaline Phosphatase: 176 U/L — ABNORMAL HIGH (ref 38–126)
Anion gap: 11 (ref 5–15)
BILIRUBIN TOTAL: 1.1 mg/dL (ref 0.3–1.2)
BUN: 33 mg/dL — AB (ref 6–20)
CO2: 27 mmol/L (ref 22–32)
CREATININE: 1 mg/dL (ref 0.44–1.00)
Calcium: 9.8 mg/dL (ref 8.9–10.3)
Chloride: 105 mmol/L (ref 101–111)
Glucose, Bld: 185 mg/dL — ABNORMAL HIGH (ref 65–99)
POTASSIUM: 4.4 mmol/L (ref 3.5–5.1)
Sodium: 143 mmol/L (ref 135–145)
TOTAL PROTEIN: 7.8 g/dL (ref 6.5–8.1)

## 2016-02-14 LAB — PROTEIN ELECTROPHORESIS, SERUM
A/G RATIO SPE: 0.7 (ref 0.7–1.7)
ALPHA-2-GLOBULIN: 1.4 g/dL — AB (ref 0.4–1.0)
Albumin ELP: 3.3 g/dL (ref 2.9–4.4)
Alpha-1-Globulin: 0.3 g/dL (ref 0.0–0.4)
BETA GLOBULIN: 1.1 g/dL (ref 0.7–1.3)
GAMMA GLOBULIN: 1.7 g/dL (ref 0.4–1.8)
Globulin, Total: 4.5 g/dL — ABNORMAL HIGH (ref 2.2–3.9)
Total Protein ELP: 7.8 g/dL (ref 6.0–8.5)

## 2016-02-14 LAB — TRIGLYCERIDES: TRIGLYCERIDES: 587 mg/dL — AB (ref <150)

## 2016-02-14 LAB — PHOSPHORUS: PHOSPHORUS: 4.3 mg/dL (ref 2.5–4.6)

## 2016-02-15 DIAGNOSIS — Z430 Encounter for attention to tracheostomy: Secondary | ICD-10-CM | POA: Diagnosis not present

## 2016-02-15 DIAGNOSIS — J9600 Acute respiratory failure, unspecified whether with hypoxia or hypercapnia: Secondary | ICD-10-CM | POA: Diagnosis not present

## 2016-02-15 NOTE — Progress Notes (Signed)
   Name: Brenda Soto MRN: [id number] DOB: 1961-01-24    ADMISSION DATE:  01/18/2016 CONSULTATION DATE:  9/27  REFERRING MD :  Sharyon MedicusHijazi ([facility]   BRIEF PATIENT DESCRIPTION: MO female on vent  SIGNIFICANT EVENTS   STUDIES:   HISTORY OF PRESENT ILLNESS:  55yo female obese female smoker with hx chronic respiratory failure r/t COPD and diverticulitis who was originally admitted to outside hospital 9/14 with abd pain.  She was found to have sigmoid abscess with perforation and was taken urgently to OR for exploratory lap with sigmoidectomy and diverting ileostomy.  She remained vented post op and failed numerous SBT's.  Course was c/b severe sepsis r/t peritonitis.  She was tx [facility] for ongoing attempts at vent wean.  PCCM consulted to assist.    SUBJECTIVE:  Remains on TC Not speaking well with 6 cuffed  VITAL SIGNS: Reviewed at bedside. No fevers, HR 75, BP wnl, rr 14  PHYSICAL EXAMINATION: General:awake in bved, not speaking, no distress Neuro: nonfocal, FC HEENT: trach mild secretions PULM: CTa ant CV: s1 s2 RRR GI: soft bs wnlk,  No r Extremities: min edema    Recent Labs Lab 02/11/16 0700 02/13/16 0530 02/14/16 0500  NA 139 142 143  K 4.5 3.8 4.4  CL 103 104 105  CO2 27 28 27   BUN 33* 33* 33*  CREATININE 1.06* 1.01* 1.00  GLUCOSE 207* 153* 185*   No results for input(s): HGB, HCT, WBC, PLT in the last 168 hours. No results found.  ASSESSMENT / PLAN:  Acute on chronic respiratory failure - post exploratory lap with severe sepsis and underlying severe COPD.  S/P tracheostomy- not speaking Severe sepsis/peritonitis - resolved.  Fungemia (was on TPN) AKI  Anxiety  REC -  she is doing well on TC overall, but not phonating well Its likely trach is taking up most of space of the airway and not allowing air to pass above cords Would downsize to 4 cuffless and re assess pmv If speaking then maximize use daytime If not speaking then would  scope  D/w Dr Arsenio LoaderH  Jace Dowe J. Tyson AliasFeinstein, MD, FACP Pgr: [phone] [facility]

## 2016-02-16 ENCOUNTER — Other Ambulatory Visit (HOSPITAL_COMMUNITY): Payer: Self-pay

## 2016-02-16 ENCOUNTER — Institutional Professional Consult (permissible substitution) (HOSPITAL_COMMUNITY): Payer: Self-pay

## 2016-02-16 LAB — UIFE/LIGHT CHAINS/TP QN, 24-HR UR
% BETA, Urine: 14.1 %
ALBUMIN, U: 49.6 %
ALPHA 1 URINE: 3.7 %
Alpha 2, Urine: 14.6 %
FREE KAPPA/LAMBDA RATIO: 7.89 (ref 2.04–10.37)
Free Lambda Lt Chains,Ur: 122 mg/L — ABNORMAL HIGH (ref 0.24–6.66)
Free Lt Chn Excr Rate: 963 mg/L — ABNORMAL HIGH (ref 1.35–24.19)
GAMMA GLOBULIN URINE: 18 %
PDF-U24IEL: 0
TIME-UPE24: 24 h
TOTAL PROTEIN, URINE-UPE24: 189.8 mg/dL
Total Protein, Urine-Ur/day: 1898 mg/24 hr — ABNORMAL HIGH (ref 30–150)
Total Volume: 1000

## 2016-02-16 LAB — BASIC METABOLIC PANEL
ANION GAP: 10 (ref 5–15)
BUN: 33 mg/dL — ABNORMAL HIGH (ref 6–20)
CALCIUM: 10.1 mg/dL (ref 8.9–10.3)
CO2: 25 mmol/L (ref 22–32)
Chloride: 108 mmol/L (ref 101–111)
Creatinine, Ser: 1.19 mg/dL — ABNORMAL HIGH (ref 0.44–1.00)
GFR, EST AFRICAN AMERICAN: 58 mL/min — AB (ref >60)
GFR, EST NON AFRICAN AMERICAN: 50 mL/min — AB (ref >60)
GLUCOSE: 232 mg/dL — AB (ref 65–99)
POTASSIUM: 4.4 mmol/L (ref 3.5–5.1)
Sodium: 143 mmol/L (ref 135–145)

## 2016-02-16 LAB — CBC
HEMATOCRIT: 32.5 % — AB (ref 36.0–46.0)
HEMOGLOBIN: 10 g/dL — AB (ref 12.0–15.0)
MCH: 27.6 pg (ref 26.0–34.0)
MCHC: 30.8 g/dL (ref 30.0–36.0)
MCV: 89.8 fL (ref 78.0–100.0)
Platelets: 368 10*3/uL (ref 150–400)
RBC: 3.62 MIL/uL — ABNORMAL LOW (ref 3.87–5.11)
RDW: 15.8 % — ABNORMAL HIGH (ref 11.5–15.5)
WBC: 31 10*3/uL — ABNORMAL HIGH (ref 4.0–10.5)

## 2016-02-17 LAB — URINE CULTURE: Culture: NO GROWTH

## 2016-02-18 DIAGNOSIS — J9600 Acute respiratory failure, unspecified whether with hypoxia or hypercapnia: Secondary | ICD-10-CM | POA: Diagnosis not present

## 2016-02-18 DIAGNOSIS — K659 Peritonitis, unspecified: Secondary | ICD-10-CM

## 2016-02-18 LAB — BASIC METABOLIC PANEL
ANION GAP: 11 (ref 5–15)
BUN: 52 mg/dL — ABNORMAL HIGH (ref 6–20)
CALCIUM: 9.8 mg/dL (ref 8.9–10.3)
CHLORIDE: 110 mmol/L (ref 101–111)
CO2: 22 mmol/L (ref 22–32)
CREATININE: 1.33 mg/dL — AB (ref 0.44–1.00)
GFR calc Af Amer: 51 mL/min — ABNORMAL LOW (ref >60)
GFR calc non Af Amer: 44 mL/min — ABNORMAL LOW (ref >60)
Glucose, Bld: 230 mg/dL — ABNORMAL HIGH (ref 65–99)
POTASSIUM: 4.7 mmol/L (ref 3.5–5.1)
Sodium: 143 mmol/L (ref 135–145)

## 2016-02-18 NOTE — Progress Notes (Signed)
   Name: Brenda Soto MRN: [id number] DOB: 03-Jul-1960    ADMISSION DATE:  01/18/2016 CONSULTATION DATE:  9/27  REFERRING MD :  Sharyon MedicusHijazi ([facility]   BRIEF PATIENT DESCRIPTION: MO female on vent  SIGNIFICANT EVENTS   STUDIES:   HISTORY OF PRESENT ILLNESS:  55yo female obese female smoker with hx chronic respiratory failure r/t COPD and diverticulitis who was originally admitted to outside hospital 9/14 with abd pain.  She was found to have sigmoid abscess with perforation and was taken urgently to OR for exploratory lap with sigmoidectomy and diverting ileostomy.  She remained vented post op and failed numerous SBT's.  Course was c/b severe sepsis r/t peritonitis.  She was tx [facility] for ongoing attempts at vent wean.  PCCM consulted to assist.    SUBJECTIVE:  Speaks easily with 4 cuffless  VITAL SIGNS: Reviewed at bedside.   PHYSICAL EXAMINATION: General:awake, no distress Neuro: nonfocal, FC HEENT: trach mild secretions on flange, erythema and warm soft tissue around trach and on sternal notch, no fluctuance PULM: CTa ant CV: s1 s2 RRR GI: soft bs wnlk,  No r Extremities: min edema    Recent Labs Lab 02/14/16 0500 02/16/16 0641 02/18/16 1130  NA 143 143 143  K 4.4 4.4 4.7  CL 105 108 110  CO2 27 25 22   BUN 33* 33* 52*  CREATININE 1.00 1.19* 1.33*  GLUCOSE 185* 232* 230*    Recent Labs Lab 02/16/16 1455  HGB 10.0*  HCT 32.5*  WBC 31.0*  PLT 368   No results found.  ASSESSMENT / PLAN:  Acute on chronic respiratory failure - post exploratory lap with severe sepsis and underlying severe COPD.  S/P tracheostomy- not speaking Severe sepsis/peritonitis - resolved.  Fungemia (was on TPN) AKI  Anxiety cellulitis neck , peri trach At risk Abscess  REC -  She is speaking well wih 4 cuffless can maintain daytime pmv unless secretions increase This cellulitic concerning Would have a low threshold to ct neck and chest to assess for abscess She may  need debridement, [facility] Continued aggressive polymicrobial coverage Is the area gets worse or she needs to go to OR, would consider change back to 6 with cuff in OR Of course no decannulation planned  Mcarthur Rossettianiel J. Tyson AliasFeinstein, MD, FACP Pgr: [phone] [facility]

## 2016-02-19 LAB — CULTURE, RESPIRATORY

## 2016-02-19 LAB — CBC
HCT: 28.9 % — ABNORMAL LOW (ref 36.0–46.0)
Hemoglobin: 8.6 g/dL — ABNORMAL LOW (ref 12.0–15.0)
MCH: 26.9 pg (ref 26.0–34.0)
MCHC: 29.8 g/dL — AB (ref 30.0–36.0)
MCV: 90.3 fL (ref 78.0–100.0)
PLATELETS: 309 10*3/uL (ref 150–400)
RBC: 3.2 MIL/uL — ABNORMAL LOW (ref 3.87–5.11)
RDW: 16.1 % — AB (ref 11.5–15.5)
WBC: 17.1 10*3/uL — ABNORMAL HIGH (ref 4.0–10.5)

## 2016-02-19 LAB — CULTURE, RESPIRATORY W GRAM STAIN

## 2016-02-20 LAB — CBC WITH DIFFERENTIAL/PLATELET
BASOS ABS: 0.1 10*3/uL (ref 0.0–0.1)
Basophils Relative: 0 %
Eosinophils Absolute: 0.7 10*3/uL (ref 0.0–0.7)
Eosinophils Relative: 5 %
HCT: 29.1 % — ABNORMAL LOW (ref 36.0–46.0)
HEMOGLOBIN: 8.6 g/dL — AB (ref 12.0–15.0)
LYMPHS ABS: 1.5 10*3/uL (ref 0.7–4.0)
LYMPHS PCT: 10 %
MCH: 27.3 pg (ref 26.0–34.0)
MCHC: 29.6 g/dL — ABNORMAL LOW (ref 30.0–36.0)
MCV: 92.4 fL (ref 78.0–100.0)
Monocytes Absolute: 0.8 10*3/uL (ref 0.1–1.0)
Monocytes Relative: 5 %
NEUTROS PCT: 80 %
Neutro Abs: 11.7 10*3/uL — ABNORMAL HIGH (ref 1.7–7.7)
Platelets: 295 10*3/uL (ref 150–400)
RBC: 3.15 MIL/uL — AB (ref 3.87–5.11)
RDW: 16.5 % — ABNORMAL HIGH (ref 11.5–15.5)
WBC: 14.8 10*3/uL — AB (ref 4.0–10.5)

## 2016-02-20 LAB — VANCOMYCIN, TROUGH
VANCOMYCIN TR: 48 ug/mL — AB (ref 15–20)
VANCOMYCIN TR: 39 ug/mL — AB (ref 15–20)

## 2016-02-21 DIAGNOSIS — J9601 Acute respiratory failure with hypoxia: Secondary | ICD-10-CM | POA: Diagnosis not present

## 2016-02-21 DIAGNOSIS — L03818 Cellulitis of other sites: Secondary | ICD-10-CM | POA: Diagnosis not present

## 2016-02-21 LAB — CULTURE, BLOOD (ROUTINE X 2)
Culture: NO GROWTH
Culture: NO GROWTH

## 2016-02-21 LAB — TRIGLYCERIDES: Triglycerides: 614 mg/dL — ABNORMAL HIGH (ref <150)

## 2016-02-21 LAB — COMPREHENSIVE METABOLIC PANEL
ALBUMIN: 2.3 g/dL — AB (ref 3.5–5.0)
ALK PHOS: 236 U/L — AB (ref 38–126)
ALT: 21 U/L (ref 14–54)
ANION GAP: 9 (ref 5–15)
AST: 27 U/L (ref 15–41)
BUN: 43 mg/dL — ABNORMAL HIGH (ref 6–20)
CALCIUM: 9.7 mg/dL (ref 8.9–10.3)
CO2: 22 mmol/L (ref 22–32)
Chloride: 113 mmol/L — ABNORMAL HIGH (ref 101–111)
Creatinine, Ser: 1.35 mg/dL — ABNORMAL HIGH (ref 0.44–1.00)
GFR calc non Af Amer: 43 mL/min — ABNORMAL LOW (ref >60)
GFR, EST AFRICAN AMERICAN: 50 mL/min — AB (ref >60)
Glucose, Bld: 139 mg/dL — ABNORMAL HIGH (ref 65–99)
POTASSIUM: 4.2 mmol/L (ref 3.5–5.1)
SODIUM: 144 mmol/L (ref 135–145)
TOTAL PROTEIN: 8.1 g/dL (ref 6.5–8.1)
Total Bilirubin: 0.6 mg/dL (ref 0.3–1.2)

## 2016-02-21 LAB — PHOSPHORUS: Phosphorus: 4.5 mg/dL (ref 2.5–4.6)

## 2016-02-21 LAB — VANCOMYCIN, RANDOM: Vancomycin Rm: 19

## 2016-02-21 NOTE — Progress Notes (Signed)
   Name: Brenda Soto MRN: [id number] DOB: [birth year]    ADMISSION DATE:  01/18/2016 CONSULTATION DATE:  9/27  REFERRING MD :  Sharyon MedicusHijazi ([facility])   BRIEF PATIENT DESCRIPTION: MO female on vent  SIGNIFICANT EVENTS   STUDIES:   HISTORY OF PRESENT ILLNESS:  55yo female obese female smoker with hx chronic respiratory failure r/t COPD and diverticulitis who was originally admitted to outside hospital 9/14 with abd pain.  She was found to have sigmoid abscess with perforation and was taken urgently to OR for exploratory lap with sigmoidectomy and diverting ileostomy.  She remained vented post op and failed numerous SBT's.  Course was c/b severe sepsis r/t peritonitis.  She was tx to [facility] 9/26 for ongoing attempts at vent wean.  PCCM consulted to assist.    SUBJECTIVE:  No c/o.  Sitting on side of bed with PT.   VITAL SIGNS: Reviewed at bedside.   PHYSICAL EXAMINATION: General: awake, no distress Neuro: nonfocal, FC HEENT: mm moist, cuffless trach, mild erythema and small smt swelling around trach and on sternal notch, no fluctuance PULM: resps even non labored on ATC, diminished throughout  CV: s1 s2 RRR GI: soft bs wnlk,  No r Extremities: min edema    Recent Labs Lab 02/16/16 0641 02/18/16 1130 02/21/16 0617  NA 143 143 144  K 4.4 4.7 4.2  CL 108 110 113*  CO2 25 22 22   BUN 33* 52* 43*  CREATININE 1.19* 1.33* 1.35*  GLUCOSE 232* 230* 139*    Recent Labs Lab 02/16/16 1455 02/19/16 0600 02/20/16 0656  HGB 10.0* 8.6* 8.6*  HCT 32.5* 28.9* 29.1*  WBC 31.0* 17.1* 14.8*  PLT 368 309 295   No results found.  ASSESSMENT / PLAN:  Acute on chronic respiratory failure - post exploratory lap with severe sepsis and underlying severe COPD.  S/P tracheostomy- not speaking Severe sepsis/peritonitis - resolved.  Fungemia (was on TPN) AKI  Anxiety Neck cellulitis - improving    REC -  ATC as tol - vent out of room  Continue PMV as tol  Closely monitor  cellulitis area  Would have a low threshold to ct neck and chest to assess for abscess if she changes clinically  ENT following in [facility] Continued aggressive polymicrobial coverage NO decannulation planned     Dirk DressKaty Zarie Kosiba, NP 02/21/2016  9:05 AM Pager: [phone] or [phone]

## 2016-02-22 ENCOUNTER — Other Ambulatory Visit (HOSPITAL_COMMUNITY): Payer: Self-pay

## 2016-02-23 LAB — CBC
HCT: 26.7 % — ABNORMAL LOW (ref 36.0–46.0)
HEMOGLOBIN: 8 g/dL — AB (ref 12.0–15.0)
MCH: 26.7 pg (ref 26.0–34.0)
MCHC: 30 g/dL (ref 30.0–36.0)
MCV: 89 fL (ref 78.0–100.0)
PLATELETS: 231 10*3/uL (ref 150–400)
RBC: 3 MIL/uL — AB (ref 3.87–5.11)
RDW: 15.6 % — ABNORMAL HIGH (ref 11.5–15.5)
WBC: 14.9 10*3/uL — AB (ref 4.0–10.5)

## 2016-02-23 LAB — BASIC METABOLIC PANEL
ANION GAP: 9 (ref 5–15)
BUN: 35 mg/dL — ABNORMAL HIGH (ref 6–20)
CALCIUM: 9.7 mg/dL (ref 8.9–10.3)
CO2: 23 mmol/L (ref 22–32)
CREATININE: 1.05 mg/dL — AB (ref 0.44–1.00)
Chloride: 104 mmol/L (ref 101–111)
GFR, EST NON AFRICAN AMERICAN: 59 mL/min — AB (ref >60)
Glucose, Bld: 107 mg/dL — ABNORMAL HIGH (ref 65–99)
Potassium: 4.3 mmol/L (ref 3.5–5.1)
SODIUM: 136 mmol/L (ref 135–145)

## 2016-02-25 LAB — BASIC METABOLIC PANEL
ANION GAP: 9 (ref 5–15)
BUN: 38 mg/dL — ABNORMAL HIGH (ref 6–20)
CHLORIDE: 101 mmol/L (ref 101–111)
CO2: 21 mmol/L — ABNORMAL LOW (ref 22–32)
Calcium: 9.8 mg/dL (ref 8.9–10.3)
Creatinine, Ser: 1.48 mg/dL — ABNORMAL HIGH (ref 0.44–1.00)
GFR calc Af Amer: 45 mL/min — ABNORMAL LOW (ref >60)
GFR, EST NON AFRICAN AMERICAN: 39 mL/min — AB (ref >60)
Glucose, Bld: 126 mg/dL — ABNORMAL HIGH (ref 65–99)
POTASSIUM: 4.9 mmol/L (ref 3.5–5.1)
SODIUM: 131 mmol/L — AB (ref 135–145)

## 2018-01-17 IMAGING — CR DG ABD PORTABLE 1V
1 series · 1 of 1 positions shown · non-contrast
Comparison: None.

CLINICAL DATA: G-tube readjustment.  Initial encounter.

EXAM:
PORTABLE ABDOMEN - 1 VIEW

[AP]
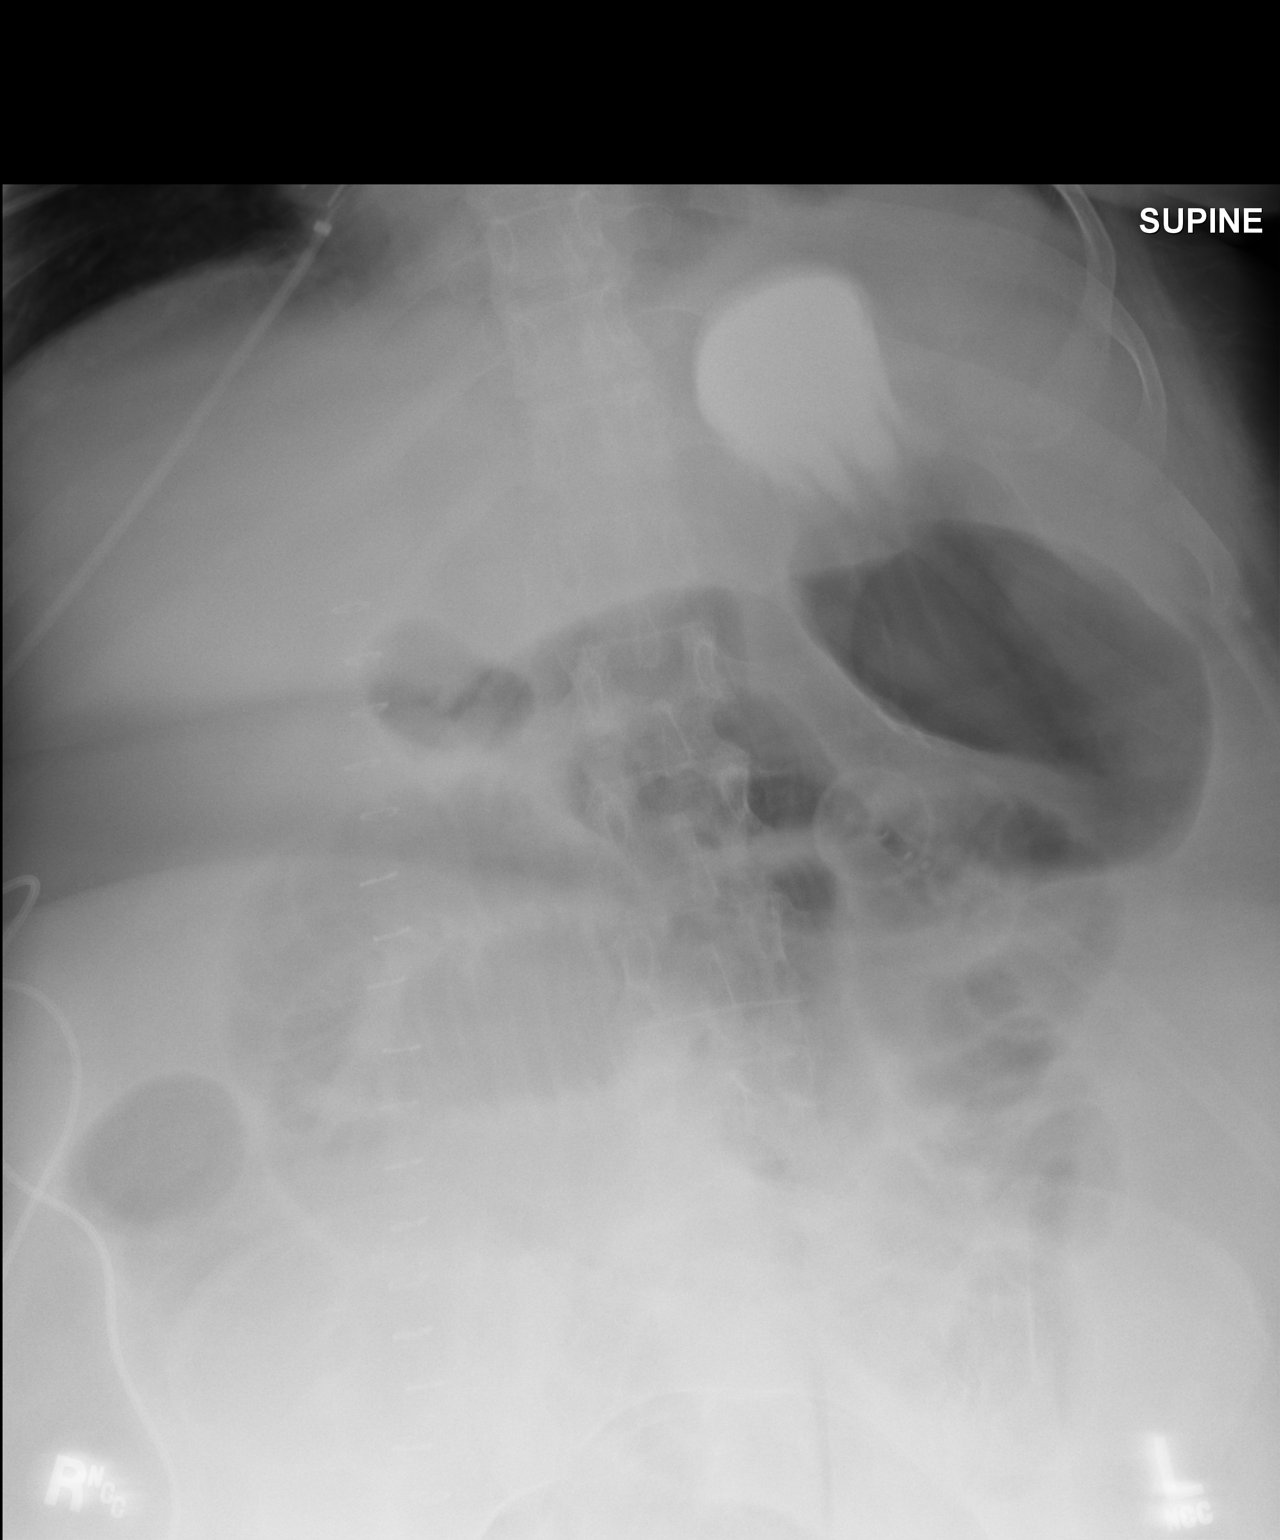

[1 of 1 positions shown; findings below may reference images not displayed]

FINDINGS: The patient's G-tube is seen overlying the body of the stomach, with
injected contrast filling the fundus of the stomach as expected.

There is dilatation of small-bowel loops to 4.9 cm in maximal
diameter, which may reflect ileus or small-bowel obstruction. Would
correlate with the patient's symptoms.

No free intra-abdominal air is seen, though evaluation for free air
is limited on a single supine view. No acute osseous abnormalities
are identified.
IMPRESSION: 1. G-tube noted overlying the body of the stomach, with injected
contrast filling the fundus of the stomach as expected.
2. Dilatation of small-bowel loops to 4.9 cm in maximal diameter,
which may reflect ileus or small-bowel obstruction. Would correlate
with the patient's symptoms.

## 2018-01-26 IMAGING — CR DG CHEST 1V PORT
2 series · 2 of 2 positions shown · non-contrast
Comparison: 01/25/2016 chest radiograph.

CLINICAL DATA: 54 y/o  F; fever.

EXAM:
PORTABLE CHEST 1 VIEW

[AP (1 of 2)]
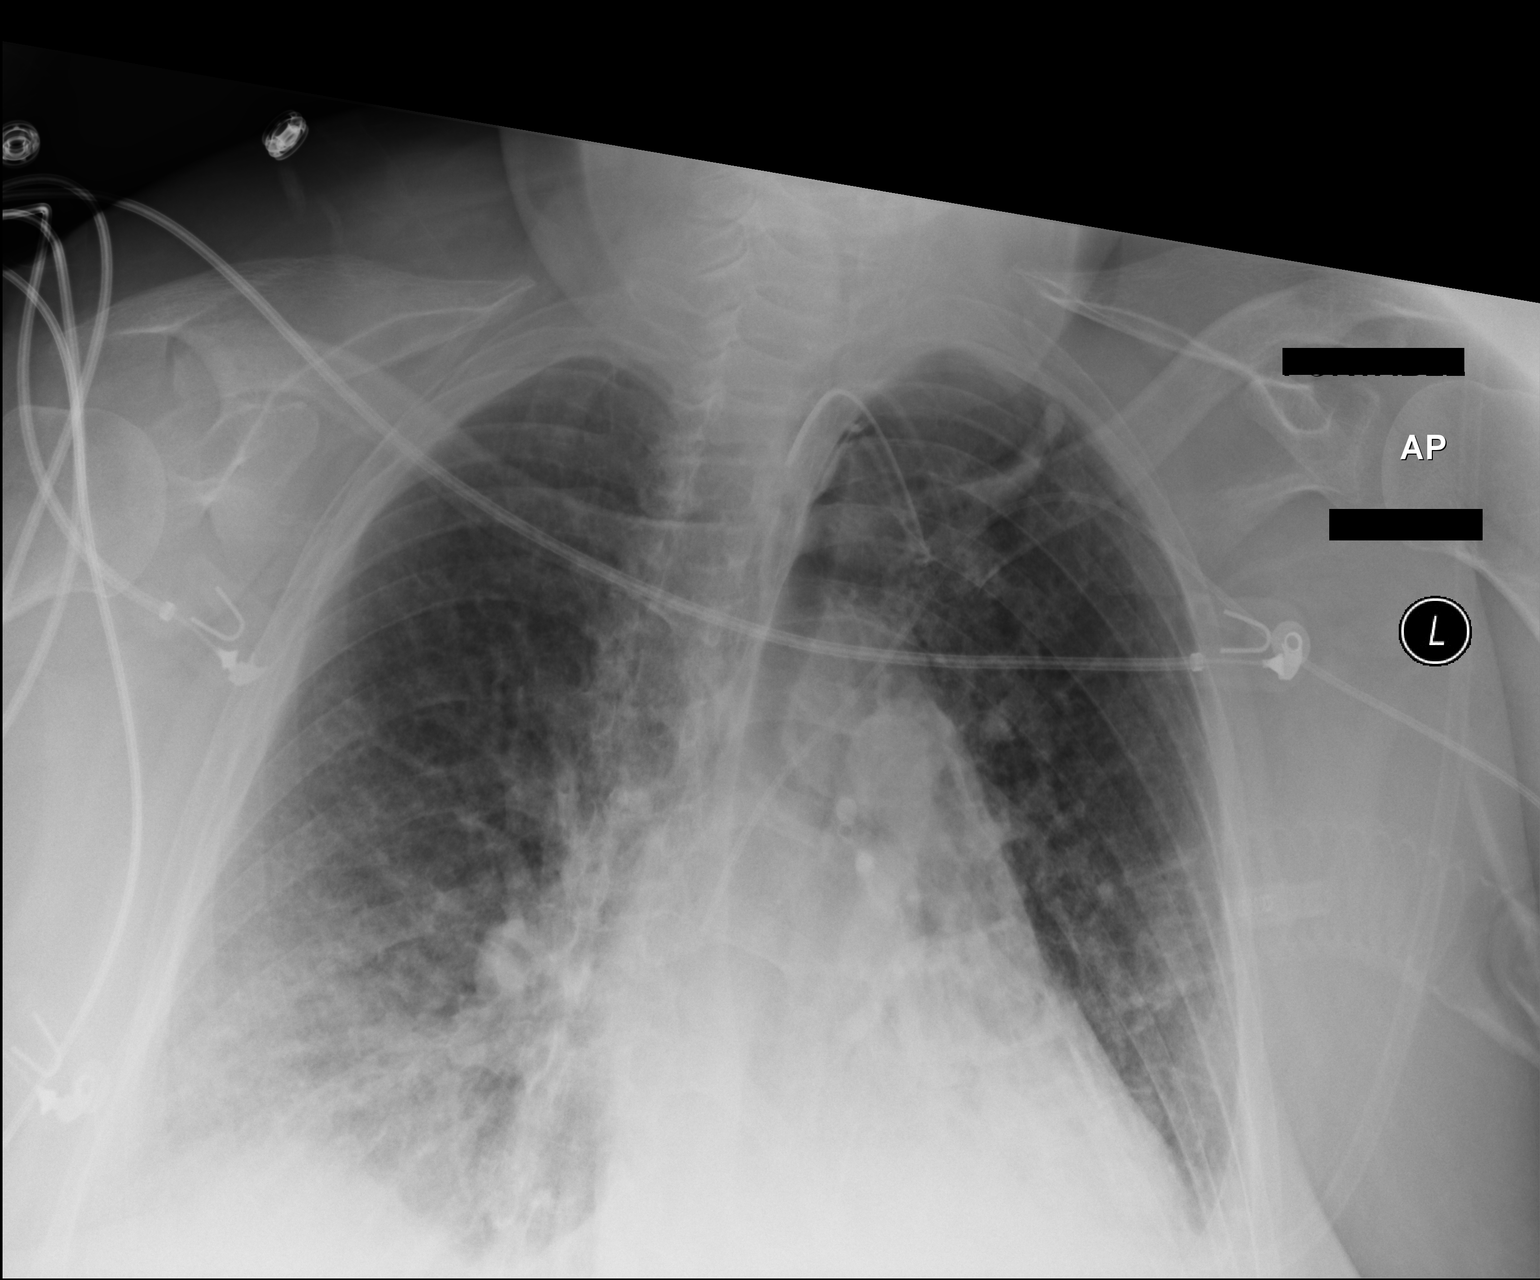

[AP (2 of 2)]
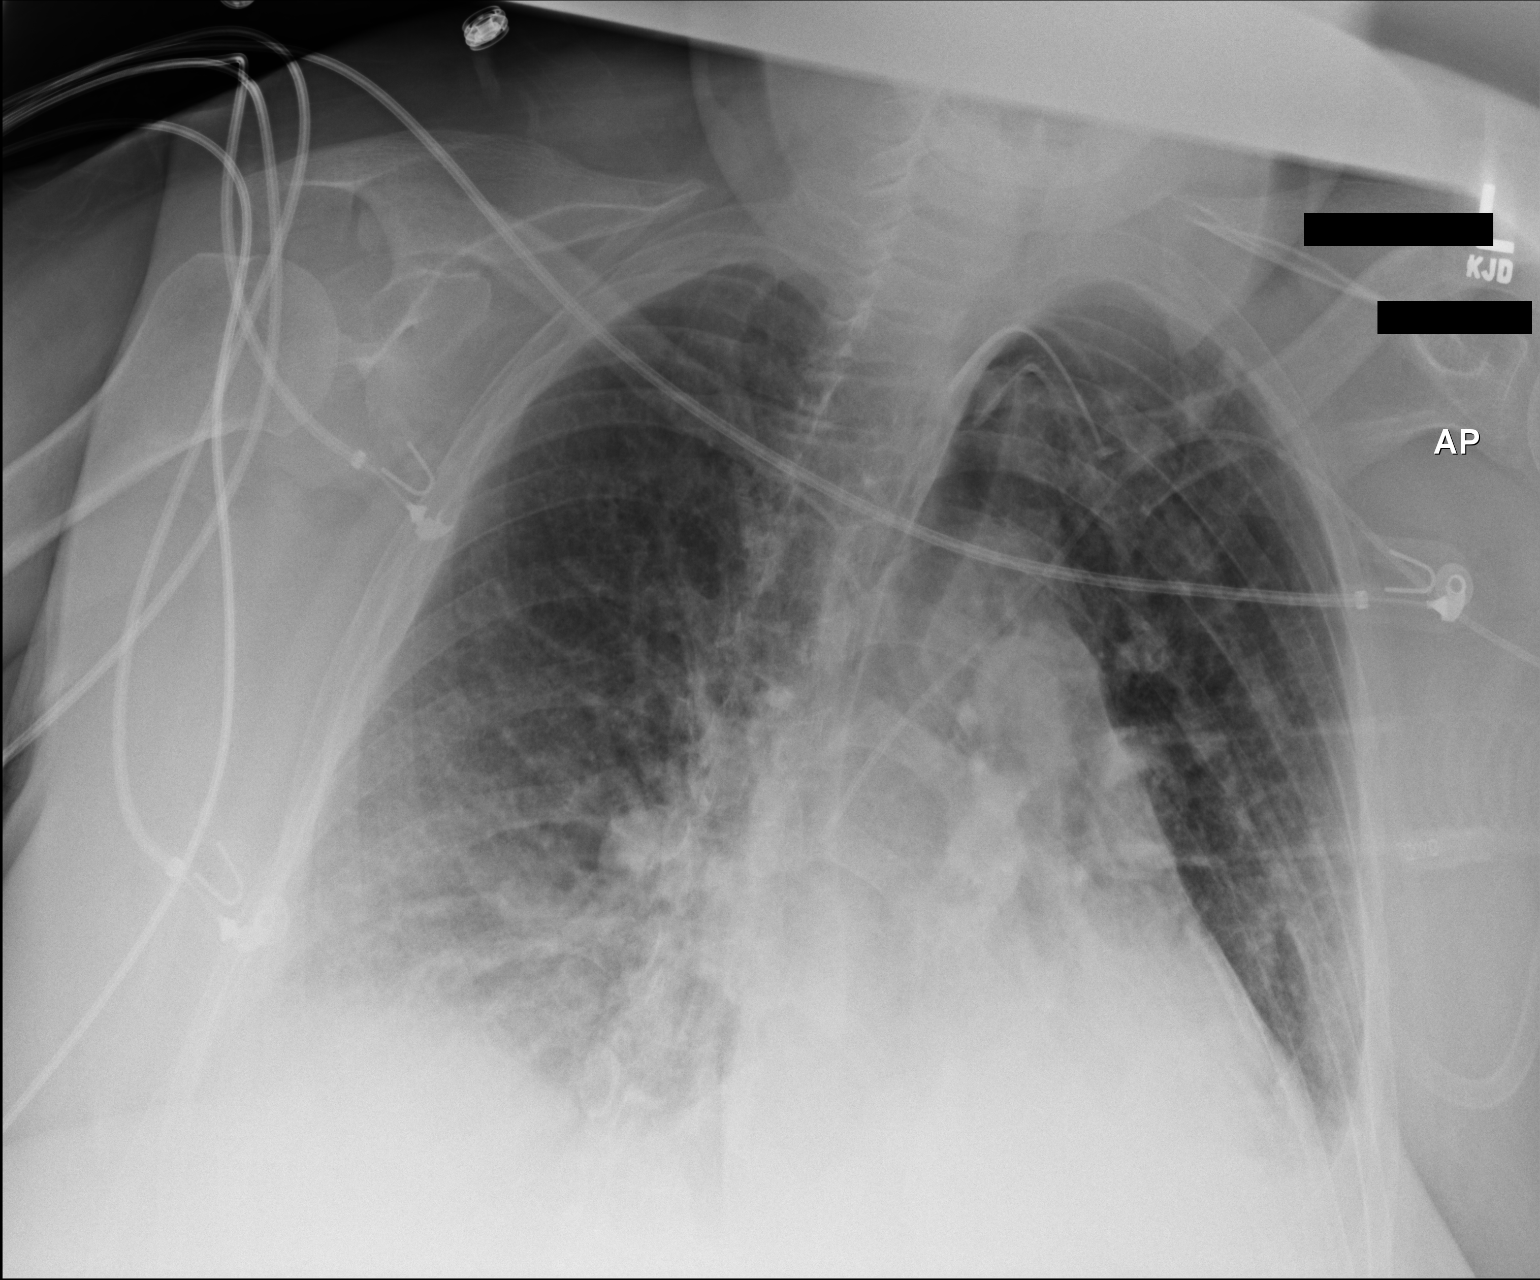

[2 of 2 positions shown; findings below may reference images not displayed]

FINDINGS: The patient is rotated. Tracheostomy tube in situ. Left central
venous catheter with tip projecting over mid SVC. Increasing
opacification of the lung bases bilaterally. Interstitial pulmonary
edema. Blunted costal for diaphragmatic angles, probable small
effusions.
IMPRESSION: Increasing opacification of lung bases probably represent small
effusions with developing atelectasis or pneumonia. Persistent
interstitial pulmonary edema.

By: Moses Yoshida M.D.

## 2018-01-30 IMAGING — CR DG CHEST 1V PORT
1 series · 1 of 1 positions shown · non-contrast
Comparison: 01/27/2016

CLINICAL DATA: Respiratory failure.

EXAM:
PORTABLE CHEST 1 VIEW

[ap portable]
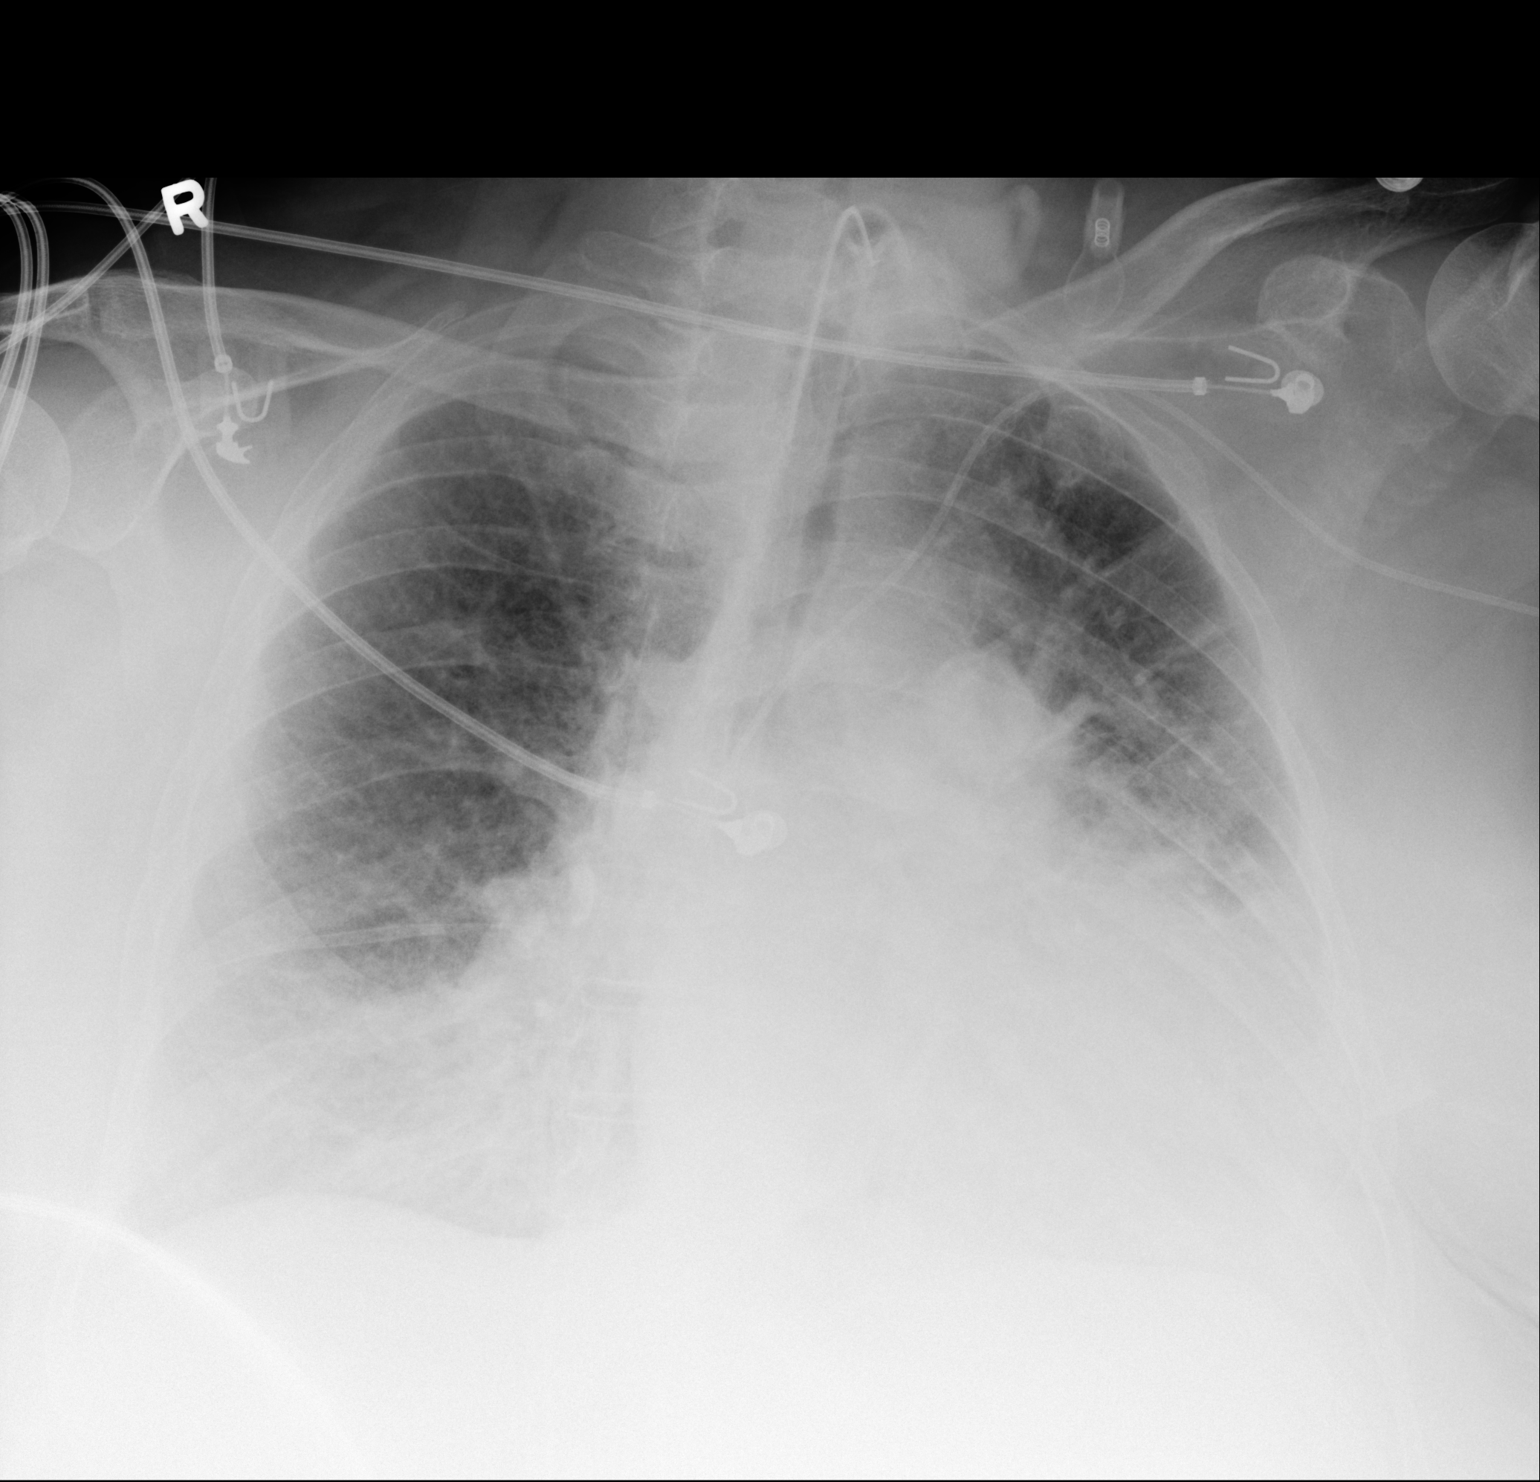

[1 of 1 positions shown; findings below may reference images not displayed]

FINDINGS: Patient rotated left. Tracheostomy suboptimally visualized centrally
but grossly unchanged. Left-sided PICC line terminates at the low
SVC. Cardiomegaly accentuated by AP portable technique. Probable
small left pleural effusion. No pneumothorax. Similar interstitial
edema, superimposed upon low lung volumes. Bibasilar airspace
disease is similar on the left and slightly increased on the right.
IMPRESSION: Minimally worsened aeration with increased right base airspace
disease. Otherwise, similar appearance of congestive heart failure,
left pleural fluid, and left base airspace disease.

## 2018-02-03 IMAGING — CR DG CHEST 1V PORT
1 series · 1 of 1 positions shown · non-contrast
Comparison: 01/31/2016

CLINICAL DATA: PICC line infection.

EXAM:
PORTABLE CHEST 1 VIEW

[portable]
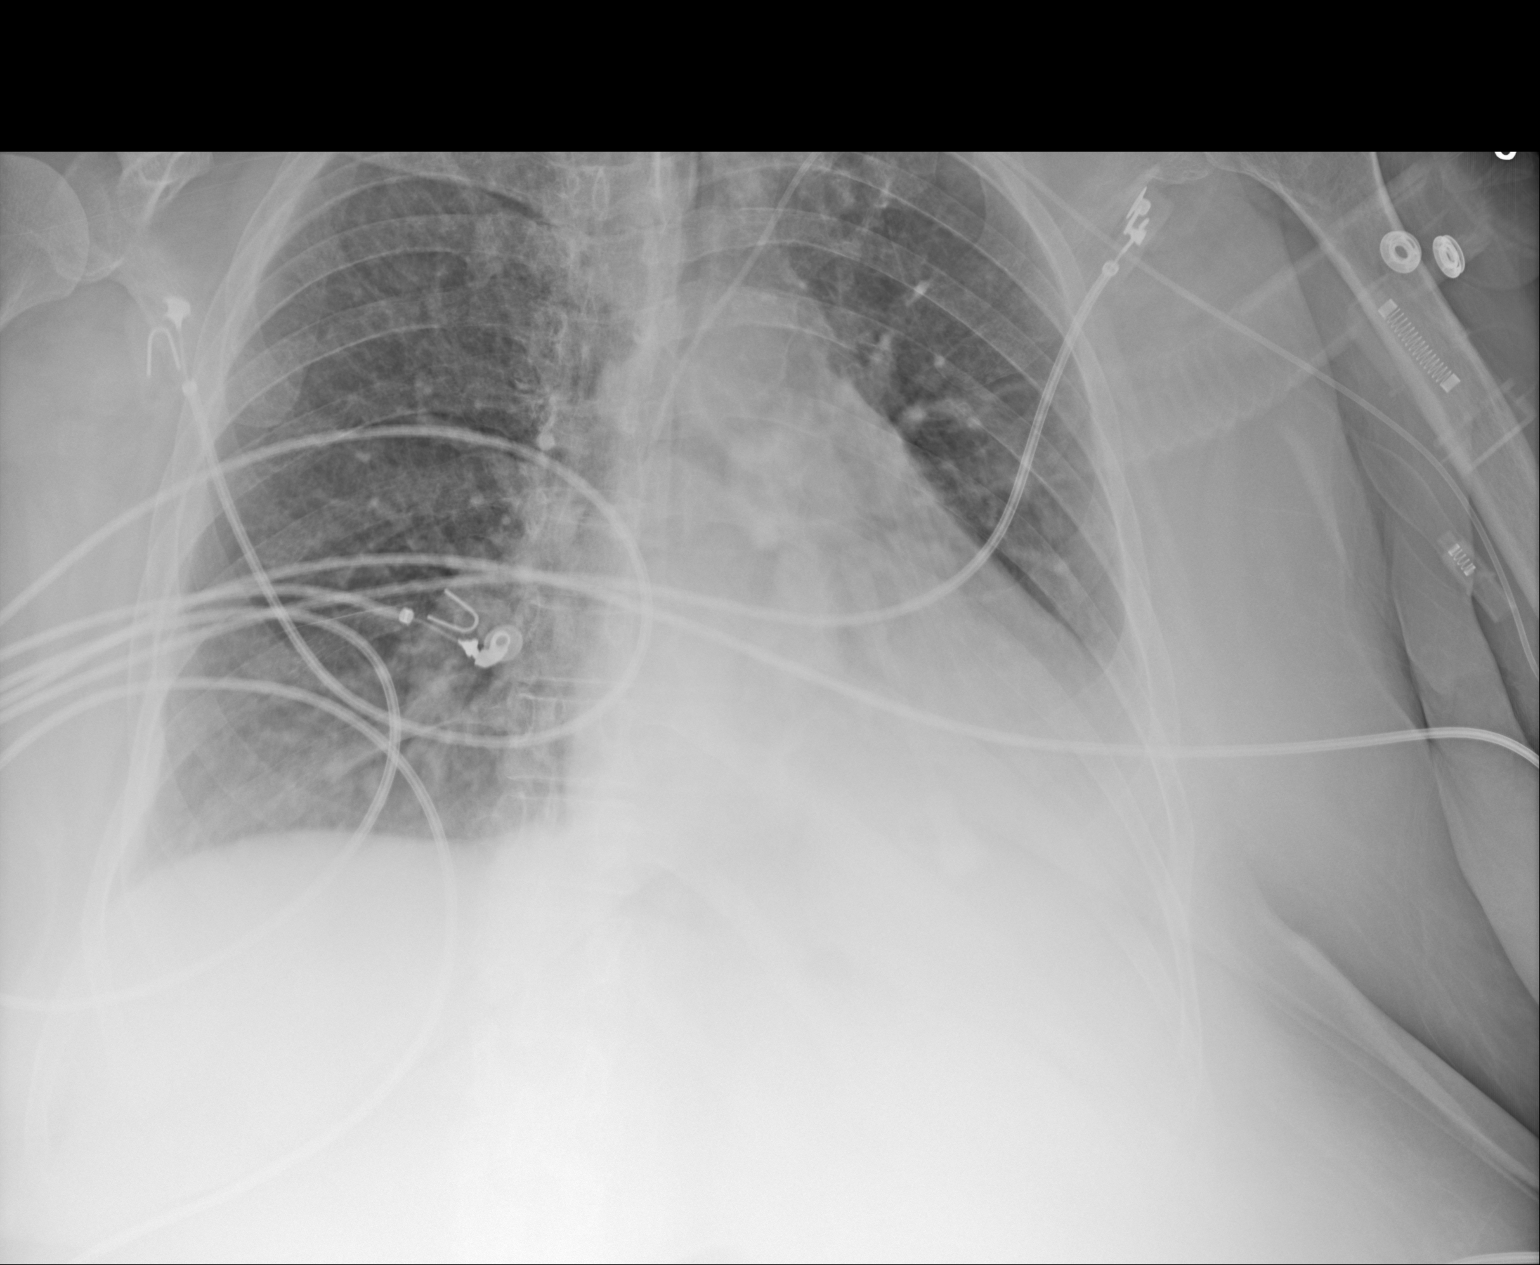

[1 of 1 positions shown; findings below may reference images not displayed]

FINDINGS: Tracheostomy remains in good position. Left arm PICC tip remains in
the SVC unchanged.

Improved aeration in the lung bases. Mild residual airspace disease
left greater than right. Improvement in vascular congestion and
probable edema. No significant pleural effusion identified
IMPRESSION: PICC tip remains in the SVC unchanged

Significant improvement in aeration bilaterally with decreased
vascular congestion and improved aeration in the lung bases
bilaterally.

## 2021-05-03 ENCOUNTER — Telehealth: Payer: Self-pay | Admitting: "Endocrinology

## 2021-05-03 NOTE — Telephone Encounter (Signed)
Received referral on pt 02/16/21. Called and lvm on 02/17/21. Patient has not responded. Closed referral
